# Patient Record
Sex: Male | Born: 1960
Health system: Southern US, Community
[De-identification: ages and names within clinical notes are randomized; demographics above are authoritative.]

## PROBLEM LIST (undated history)

## (undated) DIAGNOSIS — I10 Essential (primary) hypertension: Secondary | ICD-10-CM

## (undated) DIAGNOSIS — Z720 Tobacco use: Secondary | ICD-10-CM

## (undated) HISTORY — PX: HAND SURGERY: SHX662

## (undated) HISTORY — PX: HERNIA REPAIR: SHX51

## (undated) HISTORY — DX: Tobacco use: Z72.0

---

## 2014-04-28 ENCOUNTER — Emergency Department (HOSPITAL_COMMUNITY)
Admission: EM | Admit: 2014-04-28 | Discharge: 2014-04-28 | Disposition: A | Discharge status: Home / Self Care | Payer: BLUE CROSS/BLUE SHIELD | Attending: Emergency Medicine | Admitting: Emergency Medicine

## 2014-04-28 ENCOUNTER — Emergency Department (HOSPITAL_COMMUNITY): Payer: BLUE CROSS/BLUE SHIELD

## 2014-04-28 ENCOUNTER — Encounter (HOSPITAL_COMMUNITY): Payer: Self-pay

## 2014-04-28 DIAGNOSIS — R0602 Shortness of breath: Secondary | ICD-10-CM | POA: Diagnosis not present

## 2014-04-28 DIAGNOSIS — R079 Chest pain, unspecified: Secondary | ICD-10-CM | POA: Diagnosis present

## 2014-04-28 DIAGNOSIS — R0789 Other chest pain: Secondary | ICD-10-CM | POA: Diagnosis not present

## 2014-04-28 DIAGNOSIS — Z720 Tobacco use: Secondary | ICD-10-CM | POA: Insufficient documentation

## 2014-04-28 DIAGNOSIS — I10 Essential (primary) hypertension: Secondary | ICD-10-CM | POA: Diagnosis not present

## 2014-04-28 HISTORY — DX: Essential (primary) hypertension: I10

## 2014-04-28 LAB — URINALYSIS, ROUTINE W REFLEX MICROSCOPIC
Bilirubin Urine: NEGATIVE
Glucose, UA: NEGATIVE mg/dL
Hgb urine dipstick: NEGATIVE
Ketones, ur: NEGATIVE mg/dL
Leukocytes, UA: NEGATIVE
Nitrite: NEGATIVE
Protein, ur: NEGATIVE mg/dL
Specific Gravity, Urine: 1.017 (ref 1.005–1.030)
Urobilinogen, UA: 1 mg/dL (ref 0.0–1.0)
pH: 8 (ref 5.0–8.0)

## 2014-04-28 LAB — COMPREHENSIVE METABOLIC PANEL
ALT: 22 U/L (ref 0–53)
AST: 22 U/L (ref 0–37)
Albumin: 4 g/dL (ref 3.5–5.2)
Alkaline Phosphatase: 42 U/L (ref 39–117)
Anion gap: 9 (ref 5–15)
BUN: 12 mg/dL (ref 6–23)
CO2: 30 mmol/L (ref 19–32)
Calcium: 9 mg/dL (ref 8.4–10.5)
Chloride: 102 mmol/L (ref 96–112)
Creatinine, Ser: 0.81 mg/dL (ref 0.50–1.35)
GFR calc Af Amer: >90 mL/min (ref >90)
GFR calc non Af Amer: >90 mL/min (ref >90)
Glucose, Bld: 93 mg/dL (ref 70–99)
Potassium: 3.5 mmol/L (ref 3.5–5.1)
Sodium: 141 mmol/L (ref 135–145)
Total Bilirubin: 1.3 mg/dL — ABNORMAL HIGH (ref 0.3–1.2)
Total Protein: 7.4 g/dL (ref 6.0–8.3)

## 2014-04-28 LAB — I-STAT CHEM 8, ED
BUN: 11 mg/dL (ref 6–23)
Calcium, Ion: 1.1 mmol/L — ABNORMAL LOW (ref 1.12–1.23)
Chloride: 97 mmol/L (ref 96–112)
Creatinine, Ser: 0.8 mg/dL (ref 0.50–1.35)
Glucose, Bld: 90 mg/dL (ref 70–99)
HCT: 48 % (ref 39.0–52.0)
Hemoglobin: 16.3 g/dL (ref 13.0–17.0)
Potassium: 3.6 mmol/L (ref 3.5–5.1)
Sodium: 141 mmol/L (ref 135–145)
TCO2: 29 mmol/L (ref 0–100)

## 2014-04-28 LAB — CBC
HCT: 45.5 % (ref 39.0–52.0)
Hemoglobin: 16.3 g/dL (ref 13.0–17.0)
MCH: 33.7 pg (ref 26.0–34.0)
MCHC: 35.8 g/dL (ref 30.0–36.0)
MCV: 94 fL (ref 78.0–100.0)
Platelets: 169 10*3/uL (ref 150–400)
RBC: 4.84 MIL/uL (ref 4.22–5.81)
RDW: 12.9 % (ref 11.5–15.5)
WBC: 7.5 10*3/uL (ref 4.0–10.5)

## 2014-04-28 LAB — I-STAT TROPONIN, ED
Troponin i, poc: 0.01 ng/mL (ref 0.00–0.08)
Troponin i, poc: 0.01 ng/mL (ref 0.00–0.08)

## 2014-04-28 LAB — D-DIMER, QUANTITATIVE: D-Dimer, Quant: 0.28 ug/mL-FEU (ref 0.00–0.48)

## 2014-04-28 MED ORDER — MORPHINE SULFATE 4 MG/ML IJ SOLN
4.0000 mg | Freq: Once | INTRAMUSCULAR | Status: AC
  Administered 2014-04-28: 4 mg via INTRAVENOUS
  Filled 2014-04-28: qty 1

## 2014-04-28 MED ORDER — HYDROCODONE-ACETAMINOPHEN 5-325 MG PO TABS: 1.0000 | ORAL_TABLET | Freq: Four times a day (QID) | ORAL | Status: DC | PRN

## 2014-04-28 NOTE — ED Notes (Signed)
Patiaent c/o mid chest pain that began last night around 2200. Patient states he went to work this AM and chest pain worsened. Patient states he has SOB and chest pain is worse with breathing. Patient denies N/V, diaphoresis or weakness.

## 2014-04-28 NOTE — ED Notes (Signed)
Questions concerns denied r/t dc. Education provided regarding use of narcotics.

## 2014-04-28 NOTE — Discharge Instructions (Signed)
Chest Pain (Nonspecific) Call the [facility] today or on 05/01/2014 to schedule the next available office visit. Tell office staff that you were seen here when scheduling the appointment. Ask your primary care physician to help you to stop smoking. Take Tylenol for mild pain or the pain medicine prescribed for bad pain It is often hard to give a diagnosis for the cause of chest pain. There is always a chance that your pain could be related to something serious, such as a heart attack or a blood clot in the lungs. You need to follow up with your doctor. HOME CARE  If antibiotic medicine was given, take it as directed by your doctor. Finish the medicine even if you start to feel better.  For the next few days, avoid activities that bring on chest pain. Continue physical activities as told by your doctor.  Do not use any tobacco products. This includes cigarettes, chewing tobacco, and e-cigarettes.  Avoid drinking alcohol.  Only take medicine as told by your doctor.  Follow your doctor's suggestions for more testing if your chest pain does not go away.  Keep all doctor visits you made. GET HELP IF:  Your chest pain does not go away, even after treatment.  You have a rash with blisters on your chest.  You have a fever. GET HELP RIGHT AWAY IF:   You have more pain or pain that spreads to your arm, neck, jaw, back, or belly (abdomen).  You have shortness of breath.  You cough more than usual or cough up blood.  You have very bad back or belly pain.  You feel sick to your stomach (nauseous) or throw up (vomit).  You have very bad weakness.  You pass out (faint).  You have chills. This is an emergency. Do not wait to see if the problems will go away. Call your local emergency services (911 in [place] Do not drive yourself to the hospital. MAKE SURE YOU:   Understand these instructions.  Will watch your condition.  Will get help right away if you are not doing  well or get worse. Document Released: 07/30/2007 Document Revised: 02/15/2013 Document Reviewed: 07/30/2007 [facility] Patient Information 2015 [place], [place]. This information is not intended to replace advice given to you by your health care provider. Make sure you discuss any questions you have with your health care provider.

## 2014-04-28 NOTE — ED Notes (Signed)
Patient transported to X-ray 

## 2014-04-28 NOTE — Progress Notes (Signed)
pcp is [facility]

## 2014-04-28 NOTE — ED Notes (Signed)
MD at bedside. 

## 2014-04-28 NOTE — ED Provider Notes (Signed)
CSN: [id number]     Arrival date & time 04/28/14  0800 History   First MD Initiated Contact with Patient 04/28/14 [phone]     Chief Complaint  Patient presents with  . Chest Pain  . Shortness of Breath     (Consider location/radiation/quality/duration/timing/severity/associated sxs/prior Treatment) HPI [place] of anterior chest pain worse with deep inspiration onset 1030 p.m. yesterday. He managed to sleep all night. Pain is worse with deep inspiration and with walking. He awakened at 5:30 AM today. With the same pain. Pain became worse while at work. He denies shortness of breath denies nausea denies sweatiness . He treated himself with ibuprofen with partial relief. No other associated symptoms. Pain not improved by anything presently is moderate scribed for 6 or 7 on a scale of 1-10. Past Medical History  Diagnosis Date  . Hypertension    Past Surgical History  Procedure Laterality Date  . Hand surgery    . Hernia repair     History reviewed. No pertinent family history. History  Substance Use Topics  . Smoking status: Current Every Day Smoker -- 0.50 packs/day    Types: Cigarettes  . Smokeless tobacco: Never Used  . Alcohol Use: Yes     Comment: daily intake of 2-3 liquor drinks and 3 beers daily   no illicit drug use  Review of Systems  Cardiovascular: Positive for chest pain.  All other systems reviewed and are negative.     Allergies  Review of patient's allergies indicates not on file.  Home Medications   Prior to Admission medications   Not on File   BP 156/88 mmHg  Pulse 89  Temp(Src) 98.7 F (37.1 C) (Oral)  Resp 20  SpO2 98% Physical Exam  Constitutional: He appears well-developed and well-nourished.  HENT:  Head: Normocephalic and atraumatic.  Eyes: Conjunctivae are normal. Pupils are equal, round, and reactive to light.  Neck: Neck supple. No tracheal deviation present. No thyromegaly present.  Cardiovascular: Normal rate and regular rhythm.  Exam  reveals no friction rub.   No murmur heard. Pulmonary/Chest: Effort normal and breath sounds normal.  Abdominal: Soft. Bowel sounds are normal. He exhibits no distension. There is no tenderness.  Musculoskeletal: Normal range of motion. He exhibits no edema or tenderness.  Neurological: He is alert. Coordination normal.  Skin: Skin is warm and dry. No rash noted.  Psychiatric: He has a normal mood and affect.  Nursing note and vitals reviewed.   ED Course  Procedures (including critical care time) Labs Review Labs Reviewed  CBC  COMPREHENSIVE METABOLIC PANEL  URINALYSIS, ROUTINE W REFLEX MICROSCOPIC  I-STAT [place], ED    Imaging Review No results found.   EKG Interpretation   Date/Time:  Friday April 28 2014 08:08:19 EST Ventricular Rate:  93 PR Interval:  173 QRS Duration: 99 QT Interval:  352 QTC Calculation: 438 R Axis:   50 Text Interpretation:  Sinus rhythm ST elev, probable normal early repol  pattern No old tracing to compare Confirmed by JACUBOWITZ  MD, SAM [phone])  on 04/28/2014 8:20:18 AM     1:30 PM pain much improved after treatment with intravenous morphine. Chest x-ray viewed by me. Results for orders placed or performed during the hospital encounter of 04/28/14  CBC  Result Value Ref Range   WBC 7.5 4.0 - 10.5 K/uL   RBC 4.84 4.22 - 5.81 MIL/uL   Hemoglobin 16.3 13.0 - 17.0 g/dL   HCT 45.5 39.0 - 52.0 %   MCV 94.0 78.0 -  100.0 fL   MCH 33.7 26.0 - 34.0 pg   MCHC 35.8 30.0 - 36.0 g/dL   RDW 12.9 11.5 - 15.5 %   Platelets 169 150 - 400 K/uL  Comprehensive metabolic panel  Result Value Ref Range   Sodium 141 135 - 145 mmol/L   Potassium 3.5 3.5 - 5.1 mmol/L   Chloride 102 96 - 112 mmol/L   CO2 30 19 - 32 mmol/L   Glucose, Bld 93 70 - 99 mg/dL   BUN 12 6 - 23 mg/dL   Creatinine, Ser 0.81 0.50 - 1.35 mg/dL   Calcium 9.0 8.4 - 10.5 mg/dL   Total Protein 7.4 6.0 - 8.3 g/dL   Albumin 4.0 3.5 - 5.2 g/dL   AST 22 0 - 37 U/L   ALT 22 0 - 53 U/L    Alkaline Phosphatase 42 39 - 117 U/L   Total Bilirubin 1.3 (H) 0.3 - 1.2 mg/dL   GFR calc non Af Amer >90 >90 mL/min   GFR calc Af Amer >90 >90 mL/min   Anion gap 9 5 - 15  Urinalysis, Routine w reflex microscopic  Result Value Ref Range   Color, Urine YELLOW YELLOW   APPearance CLOUDY (A) CLEAR   Specific Gravity, Urine 1.017 1.005 - 1.030   pH 8.0 5.0 - 8.0   Glucose, UA NEGATIVE NEGATIVE mg/dL   Hgb urine dipstick NEGATIVE NEGATIVE   Bilirubin Urine NEGATIVE NEGATIVE   Ketones, ur NEGATIVE NEGATIVE mg/dL   Protein, ur NEGATIVE NEGATIVE mg/dL   Urobilinogen, UA 1.0 0.0 - 1.0 mg/dL   Nitrite NEGATIVE NEGATIVE   Leukocytes, UA NEGATIVE NEGATIVE  D-dimer, quantitative  Result Value Ref Range   D-Dimer, Quant 0.28 0.00 - 0.48 ug/mL-FEU  I-stat troponin, ED (not at [facility])  Result Value Ref Range   Troponin i, poc 0.01 0.00 - 0.08 ng/mL   Comment 3          I-stat troponin, ED  Result Value Ref Range   Troponin i, poc 0.01 0.00 - 0.08 ng/mL   Comment 3          I-stat chem 8, ed  Result Value Ref Range   Sodium 141 135 - 145 mmol/L   Potassium 3.6 3.5 - 5.1 mmol/L   Chloride 97 96 - 112 mmol/L   BUN 11 6 - 23 mg/dL   Creatinine, Ser 0.80 0.50 - 1.35 mg/dL   Glucose, Bld 90 70 - 99 mg/dL   Calcium, Ion 1.10 (L) 1.12 - 1.23 mmol/L   TCO2 29 0 - 100 mmol/L   Hemoglobin 16.3 13.0 - 17.0 g/dL   HCT 48.0 39.0 - 52.0 %   Dg Chest 2 View  04/28/2014   CLINICAL DATA:  Mid chest pain beginning last night an worsening this morning. Smoking history. Cough for the last year.  EXAM: CHEST  2 VIEW  COMPARISON:  None.  FINDINGS: Artifact overlies the chest. Heart size is normal. Mediastinal shadows are normal. There is elevation of the posterior left hemidiaphragm. The lungs are clear. No effusions. No bony abnormalities.  IMPRESSION: Normal chest with the exception of elevation of the posterior aspect of the left hemidiaphragm.   Electronically Signed   By: Nelson Chimes M.D.   On: 04/28/2014  09:03   councilled pt for 44minutes onsmoking cessation Chest xray viewed by me MDM  Patient is cared for outpatient cardiac evaluation. Heart score equals 3riskfactors,smokinghypertensionatypicalstory.Negativetroponin,age,nonspecificEKG. Low pretest clinical suspicion for pulmonary embolism. Negative d-dimer.Plan prescription Norco.  Referral [facility] Diagnosis #1atypical chest pain #2 tobacco abuse Final diagnoses:  None        Orlie Dakin, MD 04/28/14 1340

## 2014-05-26 ENCOUNTER — Ambulatory Visit (INDEPENDENT_AMBULATORY_CARE_PROVIDER_SITE_OTHER): Payer: BLUE CROSS/BLUE SHIELD | Admitting: Internal Medicine

## 2014-05-26 ENCOUNTER — Encounter: Payer: Self-pay | Admitting: Internal Medicine

## 2014-05-26 VITALS — BP 136/96 | HR 70 | Ht 70.0 in | Wt 215.1 lb

## 2014-05-26 DIAGNOSIS — R0781 Pleurodynia: Secondary | ICD-10-CM

## 2014-05-26 DIAGNOSIS — Z720 Tobacco use: Secondary | ICD-10-CM | POA: Diagnosis not present

## 2014-05-26 DIAGNOSIS — I10 Essential (primary) hypertension: Secondary | ICD-10-CM | POA: Diagnosis not present

## 2014-05-26 HISTORY — DX: Tobacco use: Z72.0

## 2014-05-26 NOTE — Progress Notes (Signed)
OFFICE NOTE  Chief Complaint:  Bursae department follow-up  Primary Care Physician: Imelda Pillow, NP  HPI:  Brian Barrett is a pleasant 54 year old male who was referred to me from the emergency department for follow-up of chest pain. He presented with an achy chest pain that was worse with taking deep inspirations. It was present for several days. It seemed to respond to pain medicine and ultimately went away. He's had no recurrence or chest pain or shortness of breath with exertion. He stays fairly active, walking at work and plays golf. He denies any history of heart disease. There is no family history of heart disease. He does have hypertension which is been treated for about 5 years but may been present longer. Blood pressure is elevated today however he reports he takes his blood pressure medicine the afternoons. EKG in the hospital showed elevated voltages and J-point elevation concerning for I repeated an EKG today and it looks unchanged. Hospital workup failed to reveal any ischemia and troponins were negative.  PMHx:  Past Medical History  Diagnosis Date  . Hypertension     Past Surgical History  Procedure Laterality Date  . Hand surgery    . Hernia repair      FAMHx:  Family History  Problem Relation Age of Onset  . Other Mother     Mother died of a blood transfusion reaction  SOCHx:   reports that he has been smoking Cigarettes.  He has been smoking about 0.50 packs per day. He has never used smokeless tobacco. He reports that he drinks alcohol. He reports that he does not use illicit drugs.  ALLERGIES:  No Known Allergies  ROS: A comprehensive review of systems was negative.  HOME MEDS: Current Outpatient Prescriptions  Medication Sig Dispense Refill  . ibuprofen (ADVIL,MOTRIN) 200 MG tablet Take 600 mg by mouth every 4 (four) hours as needed for headache, moderate pain or cramping.    Marland Kitchen lisinopril-hydrochlorothiazide (PRINZIDE,ZESTORETIC)  10-12.5 MG per tablet Take 1 tablet by mouth daily.     No current facility-administered medications for this visit.    LABS/IMAGING: No results found for this or any previous visit (from the past 48 hour(s)). No results found.  WEIGHTS: Wt Readings from Last 3 Encounters:  05/26/14 215 lb 1.6 oz (97.569 kg)    VITALS: BP 136/96 mmHg  Pulse 70  Ht 5\' 10"  (1.778 m)  Wt 215 lb 1.6 oz (97.569 kg)  BMI 30.86 kg/m2  EXAM: General appearance: alert and no distress Neck: no carotid bruit, no JVD and thyroid not enlarged, symmetric, no tenderness/mass/nodules Lungs: clear to auscultation bilaterally Heart: regular rate and rhythm, S1, S2 normal, no murmur, click, rub or gallop Abdomen: soft, non-tender; bowel sounds normal; no masses,  no organomegaly Extremities: extremities normal, atraumatic, no cyanosis or edema Pulses: 2+ and symmetric Skin: Skin color, texture, turgor normal. No rashes or lesions Neurologic: Grossly normal Psych: Normal  EKG: Normal sinus rhythm at 63, minimum voltage criteria for LVH with repolarization changes  ASSESSMENT: 1. Pleuritic chest pain-resolved 2. Hypertension 3. Voltage criteria for LVH with repolarization 4. Tobacco abuse  PLAN: 1.   Mr. Heal has had hypertension for least 5 years and seems to be well-controlled. He does have some EKG changes concerning for LVH with repolarization. His chest pain was atypical and did not sound cardiac. He's continued to be active without any symptoms. I don't feel a further cardiac workup is necessary at this time. He should have aggressive  risk factor modification. He is a smoker and we talked about tobacco cessation. He has successfully quit in the past. I'm happy to see him back on an as-needed basis.  Pixie Casino, MD, [facility] Attending Cardiologist [facility]  HILTY,Kenneth C 05/26/2014, 4:45 PM

## 2014-05-26 NOTE — Patient Instructions (Signed)
Your physician recommends that you schedule a follow-up appointment as needed  

## 2014-05-30 NOTE — Addendum Note (Signed)
Addended by: Diana Eves on: 05/30/2014 05:47 PM   Modules accepted: Orders

## 2014-06-11 ENCOUNTER — Encounter: Payer: Self-pay | Admitting: Family Medicine

## 2014-07-04 ENCOUNTER — Ambulatory Visit (INDEPENDENT_AMBULATORY_CARE_PROVIDER_SITE_OTHER): Payer: BLUE CROSS/BLUE SHIELD | Admitting: Podiatry

## 2014-07-04 ENCOUNTER — Encounter: Payer: Self-pay | Admitting: Podiatry

## 2014-07-04 ENCOUNTER — Ambulatory Visit (INDEPENDENT_AMBULATORY_CARE_PROVIDER_SITE_OTHER): Payer: BLUE CROSS/BLUE SHIELD

## 2014-07-04 VITALS — BP 143/85 | HR 72 | Resp 16

## 2014-07-04 DIAGNOSIS — L608 Other nail disorders: Secondary | ICD-10-CM | POA: Diagnosis not present

## 2014-07-04 DIAGNOSIS — Q828 Other specified congenital malformations of skin: Secondary | ICD-10-CM

## 2014-07-04 DIAGNOSIS — L301 Dyshidrosis [pompholyx]: Secondary | ICD-10-CM | POA: Diagnosis not present

## 2014-07-04 DIAGNOSIS — M79673 Pain in unspecified foot: Secondary | ICD-10-CM | POA: Diagnosis not present

## 2014-07-04 DIAGNOSIS — L603 Nail dystrophy: Secondary | ICD-10-CM

## 2014-07-04 NOTE — Progress Notes (Signed)
   Subjective:    Patient ID: Brian Barrett, male    DOB: 1960/10/28, 54 y.o.   MRN: [id number]  HPI Comments:  N: Fungis on l<R big toe, Callusis on Left foot L: Left foot D: Last 6 months O:  C: On and off, nagging  A: When walking T: Use foot powder from over the counter     Review of Systems  All other systems reviewed and are negative.      Objective:   Physical Exam: I have reviewed his past medical history medications allergies surgery social history and review of systems. Pulses are strongly palpable bilateral. Neurologic sensorium is intact per Semmes-Weinstein monofilament. Deep tendon reflexes are intact bilateral and muscle strength is 5 over 5 dorsiflexion plantar flexors and inverters and everters all intrinsic musculature is intact. Orthopedic evaluation demonstrates all joints distal to the ankle for range of motion with exception of mild hammertoe deformities bilateral. Cutaneous evaluation of straight supple well-hydrated cutis thick yellow dystrophic, mycotic hallux nails bilateral. Maceration between the digits is indicative hyperhidrosis and superficial reactive hyperkeratosis to the lateral aspect of the PIPJ second digit of the left foot secondary to maceration as well as juxtaposition of his hammertoe deformities.        Assessment & Plan:  Assessment: Painful corn and callus to his second digit left foot. Hyperhidrosis with skin breakdown. Onychomycosis nail dystrophy hallux bilateral.  Plan: Discussed appropriate shoe gear stretching exercises ice therapy shoe gear modifications debrided the area of reactive hyperkeratosis today and discussed aluminum chloride to be placed between the toes. I also took samples of the nails and skin today to be sent for pathologic evaluation will follow up with him in the near future. We also placed padding between the toe.

## 2014-07-11 LAB — HM COLONOSCOPY

## 2014-07-25 ENCOUNTER — Encounter: Payer: Self-pay | Admitting: Podiatry

## 2014-07-25 ENCOUNTER — Ambulatory Visit (INDEPENDENT_AMBULATORY_CARE_PROVIDER_SITE_OTHER): Payer: BLUE CROSS/BLUE SHIELD | Admitting: Podiatry

## 2014-07-25 DIAGNOSIS — Z79899 Other long term (current) drug therapy: Secondary | ICD-10-CM

## 2014-07-25 DIAGNOSIS — L603 Nail dystrophy: Secondary | ICD-10-CM

## 2014-07-25 MED ORDER — TERBINAFINE HCL 250 MG PO TABS: 250.0000 mg | ORAL_TABLET | Freq: Every day | ORAL | Status: DC

## 2014-07-25 NOTE — Progress Notes (Signed)
He presents today for follow-up of his toenail cultures.  Objective: Onychomycosis.  Assessment: Onychomycosis.  Plan: We discussed the etiology pathology conservative versus surgical therapies. Discussed the pros and cons and use of oral versus topical versus laser therapy. At this point he would like to try oral therapy for 4 months Lamisil. We started him on this medication today and follow up with him in 30 days blood work was requested consisting of a liver profile and CBC should discuss back and normal we will notify him immediately.

## 2014-07-25 NOTE — Patient Instructions (Signed)

## 2014-08-02 ENCOUNTER — Telehealth: Payer: Self-pay | Admitting: *Deleted

## 2014-08-02 LAB — HEPATIC FUNCTION PANEL
ALT: 26 U/L (ref 0–53)
AST: 24 U/L (ref 0–37)
Albumin: 4.3 g/dL (ref 3.5–5.2)
Alkaline Phosphatase: 40 U/L (ref 39–117)
Bilirubin, Direct: 0.4 mg/dL — ABNORMAL HIGH (ref 0.0–0.3)
Indirect Bilirubin: 1.2 mg/dL (ref 0.2–1.2)
Total Bilirubin: 1.6 mg/dL — ABNORMAL HIGH (ref 0.2–1.2)
Total Protein: 6.9 g/dL (ref 6.0–8.3)

## 2014-08-02 NOTE — Telephone Encounter (Addendum)
-----   Message from Rip Harbour, [facility] sent at 08/02/2014  4:39 PM EDT ----- Left message to call for results.  Left message giving pt his order from Dr. Milinda Pointer, and pt called again and I gave him the orders verbally. Left message with wife for pt to call for lab results and instructions. ----- Message -----    From: Garrel Ridgel, DPM    Sent: 08/02/2014  12:32 PM      To: Avanell Shackleton Prevette, PMAC  Liver enzymes are good and may continue medications

## 2014-08-24 ENCOUNTER — Ambulatory Visit: Payer: BLUE CROSS/BLUE SHIELD | Admitting: Podiatry

## 2014-09-22 ENCOUNTER — Ambulatory Visit: Payer: BLUE CROSS/BLUE SHIELD | Admitting: Family Medicine

## 2014-09-26 ENCOUNTER — Encounter: Payer: Self-pay | Admitting: Family Medicine

## 2014-09-26 ENCOUNTER — Ambulatory Visit (INDEPENDENT_AMBULATORY_CARE_PROVIDER_SITE_OTHER): Payer: BLUE CROSS/BLUE SHIELD | Admitting: Family Medicine

## 2014-09-26 VITALS — BP 132/88 | HR 69 | Temp 98.4°F | Ht 70.0 in | Wt 213.5 lb

## 2014-09-26 DIAGNOSIS — Z23 Encounter for immunization: Secondary | ICD-10-CM

## 2014-09-26 DIAGNOSIS — I868 Varicose veins of other specified sites: Secondary | ICD-10-CM | POA: Diagnosis not present

## 2014-09-26 DIAGNOSIS — R21 Rash and other nonspecific skin eruption: Secondary | ICD-10-CM | POA: Diagnosis not present

## 2014-09-26 DIAGNOSIS — I8390 Asymptomatic varicose veins of unspecified lower extremity: Secondary | ICD-10-CM | POA: Insufficient documentation

## 2014-09-26 NOTE — Patient Instructions (Addendum)
BEFORE YOU LEAVE: -Tdap -schedule physical in 3-4 months, come fasting - drink plenty of water  Quit smoking  Cut back on the drinking  If skin lesions not resolve din 3 weeks please call to schedule appointment with the dermatologist  We recommend the following healthy lifestyle measures: - eat a healthy diet consisting of lots of vegetables, fruits, beans, nuts, seeds, healthy meats such as white chicken and fish and whole grains.  - avoid fried foods, fast food, processed foods, sodas, red meet and other fattening foods.  - get a least 150 minutes of aerobic exercise per week.

## 2014-09-26 NOTE — Addendum Note (Signed)
Addended by: Agnes Lawrence on: 09/26/2014 05:20 PM   Modules accepted: Orders

## 2014-09-26 NOTE — Progress Notes (Signed)
HPI:  Brian Barrett is here to establish care. Used to see Dr. Pablo Lawrence but wants to transfer as this was an urgent care.  Has the following chronic problems that require follow up and concerns today:  HTN: -dx abut 5 years ago and on medications since -meds: lisinopril-hctz 10-12.5 daily, did not take yet today -denies: CP, SOB, DOE, swelling  Tobacco: -1 ppd for the last 2 years ->30 pack year -quit for 5 years several years ago, he reports quit easily -he has a smokers cough the last year, occ night sweats, no SOB or DOE - had a chest xray  Skin Rash -started about 5 weeks ago -several spots on arms and legs -was oozing and raw, this resolved with abx, but now ring shape and itchy  ROS negative for unless reported above: fevers, unintentional weight loss, hearing or vision loss, chest pain, palpitations, struggling to breath, hemoptysis, melena, hematochezia, hematuria, falls, loc, si, thoughts of self harm  Past Medical History  Diagnosis Date  . Hypertension     Past Surgical History  Procedure Laterality Date  . Hand surgery    . Hernia repair      Family History  Problem Relation Age of Onset  . Other Mother     deceased  . Bone cancer Paternal Uncle     History   Social History  . Marital Status: Married    Spouse Name: N/A  . Number of Children: N/A  . Years of Education: N/A   Social History Main Topics  . Smoking status: Current Every Day Smoker -- 0.50 packs/day    Types: Cigarettes  . Smokeless tobacco: Never Used  . Alcohol Use: 0.0 oz/week    0 Standard drinks or equivalent per week     Comment: daily intake of 2-3 liquor drinks and 3 beers daily  . Drug Use: No  . Sexual Activity: Not on file   Other Topics Concern  . 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  lisinopril-hydrochlorothiazide (PRINZIDE,ZESTORETIC) 10-12.5 MG per tablet, Take 1 tablet by mouth daily., Disp: , Rfl:   EXAM:  Filed Vitals:   09/26/14 1621  BP: 132/88  Pulse: 69  Temp: 98.4 F (36.9 C)    Body mass index is 30.63 kg/(m^2).  GENERAL: vitals reviewed and listed above, alert, oriented, appears well hydrated and in no acute distress  HEENT: atraumatic, conjunttiva clear, no obvious abnormalities on inspection of external nose and ears  NECK: no obvious masses on inspection  LUNGS: clear to auscultation bilaterally, no wheezes, rales or rhonchi, good air movement  CV: HRRR, no peripheral edema  MS: moves all extremities without noticeable abnormality  PSYCH: pleasant and cooperative, no obvious depression or anxiety  ASSESSMENT AND PLAN:  Discussed the following assessment and plan:  Rash and nonspecific skin eruption  Varicose veins -We reviewed the PMH, PSH, FH, SH, Meds and Allergies. -We provided refills for any medications we will prescribe as needed. -We addressed current concerns per orders and patient instructions. -We have asked for records for pertinent exams, studies, vaccines and notes from previous providers. -We have advised patient to follow up per instructions below.   -Patient  advised to return or notify a doctor immediately if symptoms worsen or persist or new concerns arise.  Patient Instructions  BEFORE YOU LEAVE: -Tdap -schedule physical in 3-4 months, come fasting - drink plenty of water  Quit smoking  Cut back on the drinking  If skin lesions not resolve din 3 weeks please call to schedule appointment with the dermatologist  We recommend the following healthy lifestyle measures: - eat a healthy diet consisting of lots of vegetables, fruits, beans, nuts, seeds, healthy meats such as white chicken and fish and whole grains.  - avoid fried foods, fast food, processed foods,  sodas, red meet and other fattening foods.  - get a least 150 minutes of aerobic exercise per week.       Colin Benton R.

## 2014-09-26 NOTE — Progress Notes (Signed)
Pre visit review using our clinic review tool, if applicable. No additional management support is needed unless otherwise documented below in the visit note. 

## 2014-11-14 ENCOUNTER — Telehealth: Payer: Self-pay | Admitting: Family Medicine

## 2014-11-14 MED ORDER — LISINOPRIL-HYDROCHLOROTHIAZIDE 10-12.5 MG PO TABS: 1.0000 | ORAL_TABLET | Freq: Every day | ORAL | Status: DC

## 2014-11-14 NOTE — Telephone Encounter (Signed)
Patient needs a refill of his lisinopril-hydrochlorothiazide (PRINZIDE,ZESTORETIC) 10-12.5 MG per tablet called to [facility] on [address] please.

## 2014-12-21 ENCOUNTER — Encounter: Payer: Self-pay | Admitting: Podiatry

## 2015-02-16 ENCOUNTER — Encounter: Payer: BLUE CROSS/BLUE SHIELD | Admitting: Family Medicine

## 2015-02-22 ENCOUNTER — Encounter: Payer: BLUE CROSS/BLUE SHIELD | Admitting: Family Medicine

## 2015-07-12 ENCOUNTER — Ambulatory Visit: Payer: BLUE CROSS/BLUE SHIELD | Admitting: Family Medicine

## 2015-07-16 ENCOUNTER — Encounter: Payer: Self-pay | Admitting: Family Medicine

## 2015-07-16 ENCOUNTER — Ambulatory Visit (INDEPENDENT_AMBULATORY_CARE_PROVIDER_SITE_OTHER): Payer: BLUE CROSS/BLUE SHIELD | Admitting: Family Medicine

## 2015-07-16 VITALS — BP 142/92 | HR 77 | Temp 98.8°F | Ht 70.0 in | Wt 209.7 lb

## 2015-07-16 DIAGNOSIS — R3129 Other microscopic hematuria: Secondary | ICD-10-CM | POA: Diagnosis not present

## 2015-07-16 DIAGNOSIS — E669 Obesity, unspecified: Secondary | ICD-10-CM

## 2015-07-16 DIAGNOSIS — I10 Essential (primary) hypertension: Secondary | ICD-10-CM

## 2015-07-16 DIAGNOSIS — Z720 Tobacco use: Secondary | ICD-10-CM

## 2015-07-16 DIAGNOSIS — R310 Gross hematuria: Secondary | ICD-10-CM | POA: Diagnosis not present

## 2015-07-16 LAB — POCT URINALYSIS DIPSTICK
Bilirubin, UA: NEGATIVE
Glucose, UA: NEGATIVE
Ketones, UA: NEGATIVE
Leukocytes, UA: NEGATIVE
Nitrite, UA: NEGATIVE
Protein, UA: NEGATIVE
Spec Grav, UA: 1.02
Urobilinogen, UA: 0.2
pH, UA: 6

## 2015-07-16 NOTE — Progress Notes (Signed)
HPI:  Brian Barrett is a pleasant 55 yo here for an acute visit for:  Gross Hematuria: -occurred once 1 week ago -small amount with one episode of urination -had done heavy lifting prior to this episode -did not have any frequency, urgency, discharge, dysuria, flank pain, vomiting, nausea, fevers or any other symptoms  Of note, he saw [place] once last year, then did not follow up as advised. He has the following chronic medical problems:  HTN/Obesity: -dx about 5 years ago and on medications since -meds: lisinopril-hctz 10-12.5 daily, has not taken medications in a few months -had been really changing diet and is active and has lost weight so thought he did not need his bp medications -denies: CP, SOB, DOE, swelling  Tobacco: -still smoking  -interested in quitting, motivated somewhat -feels can quit on his own without medications to help as has done so before -he is afraid if he quits smoking he will gain weight -1 ppd fcurrently ->30 pack year -quit for 5 years several years ago, he reports quit easily -he has a smokers cough the last year, occ night sweats, no SOB or DOE - had a chest xray  Snoring/?OSA:  ROS: See pertinent positives and negatives per HPI.  Past Medical History  Diagnosis Date  . Hypertension     Past Surgical History  Procedure Laterality Date  . Hand surgery    . Hernia repair      Family History  Problem Relation Age of Onset  . Other Mother     deceased  . Bone cancer Paternal Uncle     Social History   Social History  . Marital Status: Married    Spouse Name: N/A  . Number of Children: N/A  . Years of Education: N/A   Social History Main Topics  . Smoking status: Current Every Day Smoker -- 0.50 packs/day    Types: Cigarettes  . Smokeless tobacco: Never Used  . Alcohol Use: 0.0 oz/week    0 Standard drinks or equivalent per week     Comment: daily intake of 2-3 liquor drinks and 3 beers daily  . Drug Use: No  . Sexual  Activity: Not Asked   Other Topics Concern  . 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  lisinopril-hydrochlorothiazide (PRINZIDE,ZESTORETIC) 10-12.5 MG per tablet, Take 1 tablet by mouth daily., Disp: 90 tablet, Rfl: 3  EXAM:  Filed Vitals:   07/16/15 1429  BP: 142/92  Pulse: 77  Temp: 98.8 F (37.1 C)    Body mass index is 30.09 kg/(m^2).  GENERAL: vitals reviewed and listed above, alert, oriented, appears well hydrated and in no acute distress  HEENT: atraumatic, conjunttiva clear, no obvious abnormalities on inspection of external nose and ears  NECK: no obvious masses on inspection  LUNGS: clear to auscultation bilaterally, no wheezes, rales or rhonchi, good air movement  CV: HRRR, no peripheral edema  ABD: BS+, soft, NTTP  MS: moves all extremities without noticeable abnormality  PSYCH: pleasant and cooperative, no obvious depression or anxiety  ASSESSMENT AND PLAN:  Discussed the following assessment and plan:  Microscopic hematuria -udip with blood, umicro and culture pending -given smoking hx, low threshold  for urology referral   Gross hematuria - Plan: POC Urinalysis Dipstick, Urine Microscopic Only, Urine culture  Tobacco abuse -advised to quit -counseled 3-5 minutes and offered help -he agrees needs to quit, does not want to try wellbutrin or other options to help, does want help with wt loss  Essential hypertension -restart medications -needs labs, refused today -he agreed to do at physical or follow up  Obesity: -lifestyle recs -discussed pharmacological options and he plans to try alli  -Patient advised to return or notify a doctor immediately if symptoms worsen or persist or new concerns arise.  Patient Instructions   BEFORE YOU LEAVE: -schedule blood pressure recheck in 2-4 weeks; can do fasting labs then or at physical -quitline information -Physical exam in 3 months  Will check a few other urine tests, but if negative will send referral to urologist.  Please quit smoking. Please let us know if you need help.  Restart your blood pressure medication  Alli (orlistat) is the medication we discussed for weight loss  We recommend the following healthy lifestyle measures: - eat a healthy whole foods diet consisting of regular small meals composed of vegetables, fruits, beans, nuts, seeds, healthy meats such as white chicken and fish and whole grains.  - avoid sweets, white starchy foods, fried foods, fast food, processed foods, sodas, red meet and other fattening foods.  - get a least 150-300 minutes of aerobic exercise per week.   We have ordered labs or studies at this visit. It can take up to 1-2 weeks for results and processing. IF results require follow up or explanation, we will call you with instructions. Clinically stable results will be released to your [facility]. If you have not heard from [place] or cannot find your results in [facility] in 2 weeks please contact our office at [phone].  If you are not yet signed up for [facility], please consider signing up.            Colin Benton R.

## 2015-07-16 NOTE — Patient Instructions (Addendum)
BEFORE YOU LEAVE: -schedule blood pressure recheck in 2-4 weeks; can do fasting labs then or at physical -quitline information -Physical exam in 3 months  Will check a few other urine tests, but if negative will send referral to urologist.  Please quit smoking. Please let us know if you need help.  Restart your blood pressure medication  Alli (orlistat) is the medication we discussed for weight loss  We recommend the following healthy lifestyle measures: - eat a healthy whole foods diet consisting of regular small meals composed of vegetables, fruits, beans, nuts, seeds, healthy meats such as white chicken and fish and whole grains.  - avoid sweets, white starchy foods, fried foods, fast food, processed foods, sodas, red meet and other fattening foods.  - get a least 150-300 minutes of aerobic exercise per week.   We have ordered labs or studies at this visit. It can take up to 1-2 weeks for results and processing. IF results require follow up or explanation, we will call you with instructions. Clinically stable results will be released to your [facility]. If you have not heard from [place] or cannot find your results in [facility] in 2 weeks please contact our office at [phone].  If you are not yet signed up for [facility], please consider signing up.

## 2015-07-16 NOTE — Progress Notes (Signed)
Pre visit review using our clinic review tool, if applicable. No additional management support is needed unless otherwise documented below in the visit note. 

## 2015-07-17 LAB — URINALYSIS, MICROSCOPIC ONLY: WBC, UA: NONE SEEN (ref 0–?)

## 2015-07-18 LAB — URINE CULTURE
Colony Count: NO GROWTH
Organism ID, Bacteria: NO GROWTH

## 2015-07-20 ENCOUNTER — Telehealth: Payer: Self-pay | Admitting: Family Medicine

## 2015-07-20 MED ORDER — LISINOPRIL-HYDROCHLOROTHIAZIDE 10-12.5 MG PO TABS: 1.0000 | ORAL_TABLET | Freq: Every day | ORAL | Status: DC

## 2015-07-20 NOTE — Telephone Encounter (Signed)
Rx done. 

## 2015-07-20 NOTE — Telephone Encounter (Signed)
Pt called to say he thought Dr Maudie Mercury was going to refer him to a Urologist and would like a call back

## 2015-07-20 NOTE — Telephone Encounter (Signed)
Pt was to restart his bp med, but it was not at the pharmacy. Can you send in lisinopril-hydrochlorothiazide (PRINZIDE,ZESTORETIC) 10-12.5 MG per tablet  [facility]/ [address]

## 2015-07-20 NOTE — Telephone Encounter (Signed)
Wife would also like the results of his test before the weekend and if pt will need referral. Pt would like to know before the weekend if possible. Wife states pt is at work and ok to call her.

## 2015-07-24 NOTE — Telephone Encounter (Signed)
See results note from 5/26.

## 2015-08-06 ENCOUNTER — Encounter: Payer: Self-pay | Admitting: Family Medicine

## 2015-08-06 ENCOUNTER — Ambulatory Visit (INDEPENDENT_AMBULATORY_CARE_PROVIDER_SITE_OTHER): Payer: BLUE CROSS/BLUE SHIELD | Admitting: Family Medicine

## 2015-08-06 VITALS — BP 136/92 | HR 84 | Temp 98.4°F | Ht 70.0 in | Wt 214.6 lb

## 2015-08-06 DIAGNOSIS — R310 Gross hematuria: Secondary | ICD-10-CM

## 2015-08-06 DIAGNOSIS — Z720 Tobacco use: Secondary | ICD-10-CM

## 2015-08-06 DIAGNOSIS — I10 Essential (primary) hypertension: Secondary | ICD-10-CM

## 2015-08-06 MED ORDER — LISINOPRIL-HYDROCHLOROTHIAZIDE 20-25 MG PO TABS: 1.0000 | ORAL_TABLET | Freq: Every day | ORAL | Status: DC

## 2015-08-06 NOTE — Progress Notes (Signed)
Pre visit review using our clinic review tool, if applicable. No additional management support is needed unless otherwise documented below in the visit note. 

## 2015-08-06 NOTE — Progress Notes (Signed)
HPI:  Follow up:  BP check: -hx poor compliance -restarted blood pressure medicaiton last visit -reports is tolerating well and feels great -No chest pain, shortness of breath or headaches  Hematuria: -per pt once after heavy lifting -normal micro and culture -advised urology referral or serial UAs - he opted for serial UA with micro - monthly x3 months, if any further bleeding would def advise urology referral   ROS: See pertinent positives and negatives per HPI.  Past Medical History  Diagnosis Date  . Hypertension   . Tobacco abuse 05/26/2014    Past Surgical History  Procedure Laterality Date  . Hand surgery    . Hernia repair      Family History  Problem Relation Age of Onset  . Other Mother     deceased  . Bone cancer Paternal Uncle     Social History   Social History  . Marital Status: Married    Spouse Name: N/A  . Number of Children: N/A  . Years of Education: N/A   Social History Main Topics  . Smoking status: Current Every Day Smoker -- 0.50 packs/day    Types: Cigarettes  . Smokeless tobacco: Never Used  . Alcohol Use: 0.0 oz/week    0 Standard drinks or equivalent per week     Comment: daily intake of 2-3 liquor drinks and 3 beers daily  . Drug Use: No  . Sexual Activity: Not Asked   Other Topics Concern  . 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  lisinopril-hydrochlorothiazide (PRINZIDE,ZESTORETIC) 20-25 MG tablet, Take 1 tablet by mouth daily., Disp: 90 tablet, Rfl: 3  EXAM:  Filed Vitals:   08/06/15 1621  BP: 136/92  Pulse: 84  Temp: 98.4 F (36.9 C)    Body mass index is 30.79 kg/(m^2).  GENERAL: vitals reviewed and listed above, alert, oriented, appears well hydrated and in no acute distress  HEENT:  atraumatic, conjunttiva clear, no obvious abnormalities on inspection of external nose and ears  NECK: no obvious masses on inspection  LUNGS: clear to auscultation bilaterally, no wheezes, rales or rhonchi, good air movement  CV: HRRR, no peripheral edema  MS: moves all extremities without noticeable abnormality  PSYCH: pleasant and cooperative, no obvious depression or anxiety  ASSESSMENT AND PLAN:  Discussed the following assessment and plan:  Essential hypertension -Opted to increase his blood pressure medication, can take 2 tablets until he uses at the current dose, explained this to him  Gross hematuria -Have discussed potential causes with most likely cause the heavy strenuous exercise/lifting that he did. No further symptoms, discussed low threshold to have him see a urologist given his smoking history and increased risk for bladder cancer -For now he prefers to monitor monthly and has lab visit set up later this month  Tobacco abuse -Advised to quit  -Patient advised to return or notify a doctor immediately if symptoms worsen or persist or new concerns arise.  Patient Instructions  BEFORE YOU LEAVE: -set up nurse visit for BP recheck in 2-3 months - if can do when comes for labs on the 28th that would save him a trip -follow up with Dr. Maudie Mercury for physical  as scheduled in August  Increase the blood pressure to two tablets until you use up the current pills. Then pick up the new dose from the pharmacy and take one tablet daily.  Call if any further bleeding and get urine tests monthly for 3 months.  We recommend the following healthy lifestyle measures: - eat a healthy whole foods diet consisting of regular small meals composed of vegetables, fruits, beans, nuts, seeds, healthy meats such as white chicken and fish and whole grains.  - avoid sweets, white starchy foods, fried foods, fast food, processed foods, sodas, red meet and other fattening foods.  - get a least  150-300 minutes of aerobic exercise per week.   Quit smoking. Let us know if we can help.     Colin Benton R.

## 2015-08-06 NOTE — Patient Instructions (Addendum)
BEFORE YOU LEAVE: -set up nurse visit for BP recheck in 2-3 months - if can do when comes for labs on the 28th that would save him a trip -follow up with Dr. Maudie Mercury for physical as scheduled in August  Increase the blood pressure to two tablets until you use up the current pills. Then pick up the new dose from the pharmacy and take one tablet daily.  Call if any further bleeding and get urine tests monthly for 3 months.  We recommend the following healthy lifestyle measures: - eat a healthy whole foods diet consisting of regular small meals composed of vegetables, fruits, beans, nuts, seeds, healthy meats such as white chicken and fish and whole grains.  - avoid sweets, white starchy foods, fried foods, fast food, processed foods, sodas, red meet and other fattening foods.  - get a least 150-300 minutes of aerobic exercise per week.   Quit smoking. Let us know if we can help.

## 2015-08-22 ENCOUNTER — Ambulatory Visit: Payer: BLUE CROSS/BLUE SHIELD | Admitting: *Deleted

## 2015-08-22 ENCOUNTER — Other Ambulatory Visit (INDEPENDENT_AMBULATORY_CARE_PROVIDER_SITE_OTHER): Payer: BLUE CROSS/BLUE SHIELD

## 2015-08-22 ENCOUNTER — Encounter: Payer: Self-pay | Admitting: *Deleted

## 2015-08-22 VITALS — BP 142/90

## 2015-08-22 DIAGNOSIS — R319 Hematuria, unspecified: Secondary | ICD-10-CM | POA: Diagnosis not present

## 2015-08-22 DIAGNOSIS — I10 Essential (primary) hypertension: Secondary | ICD-10-CM

## 2015-08-22 LAB — POC URINALSYSI DIPSTICK (AUTOMATED)
Bilirubin, UA: NEGATIVE
Blood, UA: NEGATIVE
Glucose, UA: NEGATIVE
Ketones, UA: NEGATIVE
Leukocytes, UA: NEGATIVE
Nitrite, UA: NEGATIVE
Protein, UA: NEGATIVE
Spec Grav, UA: 1.025
Urobilinogen, UA: 1
pH, UA: 6

## 2015-08-22 NOTE — Progress Notes (Signed)
Pt presented to office for blood pressure check and urine microscopic due to blood in urine. Pt denies headaches and no blood noticed in urine anymore.

## 2015-08-23 LAB — URINALYSIS, MICROSCOPIC ONLY: RBC / HPF: NONE SEEN (ref 0–?)

## 2015-08-24 ENCOUNTER — Telehealth: Payer: Self-pay | Admitting: Family Medicine

## 2015-08-24 NOTE — Telephone Encounter (Signed)
I called the pt and informed him I was calling in regards to his blood pressure reading from the nurse visit last week.  He is aware his wife will call back for the follow up visit in 2-4 weeks with Dr Maudie Mercury.

## 2015-08-24 NOTE — Telephone Encounter (Signed)
Pt would like UA results. Pt would like callback around 330 pm

## 2015-09-20 ENCOUNTER — Other Ambulatory Visit: Payer: BLUE CROSS/BLUE SHIELD

## 2015-10-12 ENCOUNTER — Encounter: Payer: BLUE CROSS/BLUE SHIELD | Admitting: Family Medicine

## 2015-12-05 DIAGNOSIS — R310 Gross hematuria: Secondary | ICD-10-CM | POA: Diagnosis not present

## 2015-12-05 DIAGNOSIS — K4090 Unilateral inguinal hernia, without obstruction or gangrene, not specified as recurrent: Secondary | ICD-10-CM | POA: Diagnosis not present

## 2015-12-05 DIAGNOSIS — Z125 Encounter for screening for malignant neoplasm of prostate: Secondary | ICD-10-CM | POA: Diagnosis not present

## 2016-01-14 DIAGNOSIS — R310 Gross hematuria: Secondary | ICD-10-CM | POA: Diagnosis not present

## 2016-01-14 DIAGNOSIS — R319 Hematuria, unspecified: Secondary | ICD-10-CM | POA: Diagnosis not present

## 2016-01-22 ENCOUNTER — Encounter: Payer: Self-pay | Admitting: Family Medicine

## 2016-01-22 DIAGNOSIS — I700 Atherosclerosis of aorta: Secondary | ICD-10-CM | POA: Insufficient documentation

## 2016-01-25 DIAGNOSIS — R310 Gross hematuria: Secondary | ICD-10-CM | POA: Diagnosis not present

## 2016-02-06 DIAGNOSIS — M7061 Trochanteric bursitis, right hip: Secondary | ICD-10-CM | POA: Diagnosis not present

## 2016-09-02 ENCOUNTER — Encounter: Payer: Self-pay | Admitting: Adult Health

## 2016-09-02 ENCOUNTER — Other Ambulatory Visit: Payer: Self-pay | Admitting: *Deleted

## 2016-09-02 ENCOUNTER — Ambulatory Visit (INDEPENDENT_AMBULATORY_CARE_PROVIDER_SITE_OTHER): Payer: BLUE CROSS/BLUE SHIELD | Admitting: Adult Health

## 2016-09-02 VITALS — BP 122/82 | HR 87 | Temp 98.6°F | Ht 70.0 in | Wt 208.8 lb

## 2016-09-02 DIAGNOSIS — D1721 Benign lipomatous neoplasm of skin and subcutaneous tissue of right arm: Secondary | ICD-10-CM | POA: Diagnosis not present

## 2016-09-02 MED ORDER — LISINOPRIL-HYDROCHLOROTHIAZIDE 20-25 MG PO TABS: 1.0000 | ORAL_TABLET | Freq: Every day | ORAL | 0 refills | Status: DC

## 2016-09-02 NOTE — Patient Instructions (Addendum)
It was great meeting you today .   Your exam is consistent with a lipoma. This is a non cancerous growth.     Lipoma A lipoma is a noncancerous (benign) tumor that is made up of fat cells. This is a very common type of soft-tissue growth. Lipomas are usually found under the skin (subcutaneous). They may occur in any tissue of the body that contains fat. Common areas for lipomas to appear include the back, shoulders, buttocks, and thighs. Lipomas grow slowly, and they are usually painless. Most lipomas do not cause problems and do not require treatment. What are the causes? The cause of this condition is not known. What increases the risk? This condition is more likely to develop in:  People who are 16-28 years old.  People who have a family history of lipomas.  What are the signs or symptoms? A lipoma usually appears as a small, round bump under the skin. It may feel soft or rubbery, but the firmness can vary. Most lipomas are not painful. However, a lipoma may become painful if it is located in an area where it pushes on nerves. How is this diagnosed? A lipoma can usually be diagnosed with a physical exam. You may also have tests to confirm the diagnosis and to rule out other conditions. Tests may include:  Imaging tests, such as a CT scan or MRI.  Removal of a tissue sample to be looked at under a microscope (biopsy).  How is this treated? Treatment is not needed for small lipomas that are not causing problems. If a lipoma continues to get bigger or it causes problems, removal is often the best option. Lipomas can also be removed to improve appearance. Removal of a lipoma is usually done with a surgery in which the fatty cells and the surrounding capsule are removed. Most often, a medicine that numbs the area (local anesthetic) is used for this procedure. Follow these instructions at home:  Keep all follow-up visits as directed by your health care provider. This is important. Contact  a health care provider if:  Your lipoma becomes larger or hard.  Your lipoma becomes painful, red, or increasingly swollen. These could be signs of infection or a more serious condition. This information is not intended to replace advice given to you by your health care provider. Make sure you discuss any questions you have with your health care provider. Document Released: 01/31/2002 Document Revised: 07/19/2015 Document Reviewed: 02/06/2014 Elsevier Interactive Patient Education  [employer].

## 2016-09-02 NOTE — Telephone Encounter (Signed)
Rx done. 

## 2016-09-02 NOTE — Progress Notes (Signed)
   Subjective:    Patient ID: Brian Barrett, male    DOB: Dec 18, 1960, 56 y.o.   MRN: [id number]  HPI  56 year old male who  has a past medical history of Hypertension and Tobacco abuse (05/26/2014). He presents to the office today for a concern of a " lump in my right armpit". He had not noticed it until his wife found it this morning and she wanted him to get it checked out.   He denies any pain, redness or drainage    Review of Systems See HPI   Past Medical History:  Diagnosis Date  . Hypertension   . Tobacco abuse 05/26/2014    Social History   Social History  . Marital status: Married    Spouse name: N/A  . Number of children: N/A  . Years of education: N/A   Occupational History  . Not on file.   Social History Main Topics  . Smoking status: Current Every Day Smoker    Packs/day: 0.50    Types: Cigarettes  . Smokeless tobacco: Never Used  . Alcohol use 0.0 oz/week     Comment: daily intake of 2-3 liquor drinks and 3 beers daily  . Drug use: No  . Sexual activity: Not on file   Other Topics Concern  . 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 HAND SURGERY    . HERNIA REPAIR      Family History  Problem Relation Age of Onset  . Other Mother        deceased  . Bone cancer Paternal Uncle     No Known Allergies  No current outpatient prescriptions on file prior to visit.   No current facility-administered medications on file prior to visit.     BP 122/82   Pulse 87   Temp 98.6 F (37 C) (Oral)   Ht 5\' 10"  (1.778 m)   Wt 208 lb 12.8 oz (94.7 kg)   BMI 29.96 kg/m       Objective:   Physical Exam  Constitutional: He is oriented to person, place, and time. He appears well-developed and well-nourished. No  distress.  Neurological: He is alert and oriented to person, place, and time.  Skin: Skin is warm and dry. No rash noted. He is not diaphoretic. No erythema. No pallor.  Quarter sized moveable, non tender mass in right axilla.    Psychiatric: He has a normal mood and affect. His behavior is normal. Judgment and thought content normal.  Nursing note and vitals reviewed.     Assessment & Plan:  1. Lipoma of right upper extremity - Consistent with lipoma  - Options reviewed with patient and he will opt for watchful waiting  - Follow up with PCP as needed  Dorothyann Peng, NP

## 2016-12-23 DIAGNOSIS — H35721 Serous detachment of retinal pigment epithelium, right eye: Secondary | ICD-10-CM | POA: Diagnosis not present

## 2017-03-05 ENCOUNTER — Other Ambulatory Visit: Payer: Self-pay | Admitting: Family Medicine

## 2017-04-06 ENCOUNTER — Other Ambulatory Visit: Payer: Self-pay | Admitting: Family Medicine

## 2017-04-09 ENCOUNTER — Ambulatory Visit (INDEPENDENT_AMBULATORY_CARE_PROVIDER_SITE_OTHER): Payer: BLUE CROSS/BLUE SHIELD | Admitting: Family Medicine

## 2017-04-09 ENCOUNTER — Encounter: Payer: Self-pay | Admitting: Family Medicine

## 2017-04-09 VITALS — BP 140/80 | HR 87 | Temp 98.7°F | Ht 70.0 in | Wt 207.5 lb

## 2017-04-09 DIAGNOSIS — J069 Acute upper respiratory infection, unspecified: Secondary | ICD-10-CM | POA: Diagnosis not present

## 2017-04-09 MED ORDER — BENZONATATE 100 MG PO CAPS: 100.0000 mg | ORAL_CAPSULE | Freq: Two times a day (BID) | ORAL | 0 refills | Status: DC | PRN

## 2017-04-09 NOTE — Progress Notes (Signed)
HPI:  Acute visit for respiratory illness: -started: 3 days -symptoms:nasal congestion, sore throat, cough -denies:fever, SOB, NVD, tooth pain, wheezing, malaise better nice thank you -has tried: OTC cold meds -sick contacts/travel/risks: no reported flu, strep or tick exposure, multiple folks at work with similar symptoms  ROS: See pertinent positives and negatives per HPI.  Past Medical History:  Diagnosis Date  . Hypertension   . Tobacco abuse 05/26/2014    Past Surgical History:  Procedure Laterality Date  . HAND SURGERY    . HERNIA REPAIR      Family History  Problem Relation Age of Onset  . Other Mother        deceased  . Bone cancer Paternal Uncle     Social History   Socioeconomic History  . Marital status: Married    Spouse name: None  . Number of children: None  . Years of education: None  . Highest education level: None  Social Needs  . Financial resource strain: None  . Food insecurity - worry: None  . Food insecurity - inability: None  . Transportation needs - medical: None  . Transportation needs - non-medical: None  Occupational History  . None  Tobacco Use  . Smoking status: Current Every Day Smoker    Packs/day: 0.50    Types: Cigarettes  . Smokeless tobacco: Never Used  Substance and Sexual Activity  . Alcohol use: Yes    Alcohol/week: 0.0 oz    Comment: daily intake of 2-3 liquor drinks and 3 beers daily  . Drug use: No  . Sexual activity: None  Other Topics Concern  . 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  lisinopril-hydrochlorothiazide (PRINZIDE,ZESTORETIC) 20-25 MG tablet, TAKE 1 TABLET BY MOUTH EVERY DAY. NEED APPOINTMENT, Disp: 15 tablet, Rfl: 0 .  benzonatate (TESSALON) 100 MG capsule, Take 1 capsule (100 mg  total) by mouth 2 (two) times daily as needed for cough., Disp: 20 capsule, Rfl: 0  EXAM:  Vitals:   04/09/17 1712  BP: 140/80  Pulse: 87  Temp: 98.7 F (37.1 C)  SpO2: 97%    Body mass index is 29.77 kg/m.  GENERAL: vitals reviewed and listed above, alert, oriented, appears well hydrated and in no acute distress  HEENT: atraumatic, conjunttiva clear, no obvious abnormalities on inspection of external nose and ears, normal appearance of ear canals and TMs, clear nasal congestion, mild post oropharyngeal erythema with PND, no tonsillar edema or exudate, no sinus TTP  NECK: no obvious masses on inspection  LUNGS: clear to auscultation bilaterally, no wheezes, rales or rhonchi, good air movement  CV: HRRR, no peripheral edema  MS: moves all extremities without noticeable abnormality  PSYCH: pleasant and cooperative, no obvious depression or anxiety  ASSESSMENT AND PLAN:  Discussed the following assessment and plan:  Viral upper respiratory illness  -given HPI and exam findings today, a serious infection or illness is unlikely. We discussed potential etiologies, with VURI being most likely, and advised supportive care and monitoring. We discussed treatment side effects, likely course, antibiotic misuse, transmission, and signs of developing a serious illness. -tessalon for cough after discussion risks -Discussed potential for flu, he opted against testing given mild symptoms and  out of window for likely benefit > risks for tamiflu -needs follow up BP when not sick, Labs CPE - he agrees to schedule -of course, we advised to return or notify a doctor immediately if symptoms worsen or persist or new concerns arise.    Patient Instructions  BEFORE YOU LEAVE: -follow up: physical with labs in next 3-4 weeks   INSTRUCTIONS FOR UPPER RESPIRATORY INFECTION:  -plenty of rest and fluids  -nasal saline wash 2-3 times daily (use prepackaged nasal saline or bottled/distilled water  if making your own)   -can use AFRIN nasal spray for drainage and nasal congestion - but do NOT use longer then 3-4 days  -can use tylenol (in no history of liver disease) or ibuprofen (if no history of kidney disease, bowel bleeding or significant heart disease) as directed for aches and sorethroat  -in the winter time, using a humidifier at night is helpful (please follow cleaning instructions)  -if you are taking a cough medication - use only as directed, may also try a teaspoon of honey to coat the throat and throat lozenges. I sent Tessalon to the pharmacy.  -for sore throat, salt water gargles can help  -follow up if you have fevers, facial pain, tooth pain, difficulty breathing or are worsening or symptoms persist longer then expected  Upper Respiratory Infection, Adult An upper respiratory infection (URI) is also known as the common cold. It is often caused by a type of germ (virus). Colds are easily spread (contagious). You can pass it to others by kissing, coughing, sneezing, or drinking out of the same glass. Usually, you get better in 1 to 3  weeks.  However, the cough can last for even longer. HOME CARE   Only take medicine as told by your doctor. Follow instructions provided above.  Drink enough water and fluids to keep your pee (urine) clear or pale yellow.  Get plenty of rest.  Return to work when your temperature is < 100 for 24 hours or as told by your doctor. You may use a face mask and wash your hands to stop your cold from spreading. GET HELP RIGHT AWAY IF:   After the first few days, you feel you are getting worse.  You have questions about your medicine.  You have chills, shortness of breath, or red spit (mucus).  You have pain in the face for more then 1-2 days, especially when you bend forward.  You have a fever, puffy (swollen) neck, pain when you swallow, or white spots in the back of your throat.  You have a bad headache, ear pain, sinus pain, or chest  pain.  You have a high-pitched whistling sound when you breathe in and out (wheezing).  You cough up blood.  You have sore muscles or a stiff neck. MAKE SURE YOU:   Understand these instructions.  Will watch your condition.  Will get help right away if you are not doing well or get worse. Document Released: 07/30/2007 Document Revised: 05/05/2011 Document Reviewed: 05/18/2013 [facility] Patient Information 2015 [place], [place]. This information is not intended to replace advice given to you by your health care provider. Make sure you discuss any questions you have with your health care provider.     Lucretia Kern, DO

## 2017-04-09 NOTE — Patient Instructions (Signed)
BEFORE YOU LEAVE: -follow up: physical with labs in next 3-4 weeks   INSTRUCTIONS FOR UPPER RESPIRATORY INFECTION:  -plenty of rest and fluids  -nasal saline wash 2-3 times daily (use prepackaged nasal saline or bottled/distilled water if making your own)   -can use AFRIN nasal spray for drainage and nasal congestion - but do NOT use longer then 3-4 days  -can use tylenol (in no history of liver disease) or ibuprofen (if no history of kidney disease, bowel bleeding or significant heart disease) as directed for aches and sorethroat  -in the winter time, using a humidifier at night is helpful (please follow cleaning instructions)  -if you are taking a cough medication - use only as directed, may also try a teaspoon of honey to coat the throat and throat lozenges. I sent Tessalon to the pharmacy.  -for sore throat, salt water gargles can help  -follow up if you have fevers, facial pain, tooth pain, difficulty breathing or are worsening or symptoms persist longer then expected  Upper Respiratory Infection, Adult An upper respiratory infection (URI) is also known as the common cold. It is often caused by a type of germ (virus). Colds are easily spread (contagious). You can pass it to others by kissing, coughing, sneezing, or drinking out of the same glass. Usually, you get better in 1 to 3  weeks.  However, the cough can last for even longer. HOME CARE   Only take medicine as told by your doctor. Follow instructions provided above.  Drink enough water and fluids to keep your pee (urine) clear or pale yellow.  Get plenty of rest.  Return to work when your temperature is < 100 for 24 hours or as told by your doctor. You may use a face mask and wash your hands to stop your cold from spreading. GET HELP RIGHT AWAY IF:   After the first few days, you feel you are getting worse.  You have questions about your medicine.  You have chills, shortness of breath, or red spit (mucus).  You  have pain in the face for more then 1-2 days, especially when you bend forward.  You have a fever, puffy (swollen) neck, pain when you swallow, or white spots in the back of your throat.  You have a bad headache, ear pain, sinus pain, or chest pain.  You have a high-pitched whistling sound when you breathe in and out (wheezing).  You cough up blood.  You have sore muscles or a stiff neck. MAKE SURE YOU:   Understand these instructions.  Will watch your condition.  Will get help right away if you are not doing well or get worse. Document Released: 07/30/2007 Document Revised: 05/05/2011 Document Reviewed: 05/18/2013 [facility] Patient Information 2015 [place], [place]. This information is not intended to replace advice given to you by your health care provider. Make sure you discuss any questions you have with your health care provider.

## 2017-04-23 ENCOUNTER — Telehealth: Payer: Self-pay | Admitting: Family Medicine

## 2017-04-23 NOTE — Telephone Encounter (Signed)
Copied from [place]. Topic: Quick Communication - See Telephone Encounter >> Apr 23, 2017 11:53 AM Burnis Medin, NT wrote: CRM for notification. See Telephone encounter for: Patient wife called and said pt was seen a couple of weeks ago and is still having symptoms of cough and congestion. Wife wanted to know if something can be called in to [place], [place] [phone] (Phone) [phone] (Fax)    04/23/17.

## 2017-04-24 NOTE — Telephone Encounter (Signed)
Called pt. Back- unable to leave message - the mailbox is full.

## 2017-05-11 NOTE — Progress Notes (Addendum)
HPI:  Using dictation device. Unfortunately this device frequently misinterprets words/phrases.  Here for CPE: Due for labs, hepatitis C screening, question flu vaccine-refused in the past -Concerns and/or follow up today: none Past medical history significant for hypertension, aortic atherosclerosis, obesity.  He has not been in for a physical in some time.  Reports he has had postnasal drip and a cough for several weeks.  Occasionally feels a little wheezy.  Continues to smoke about half pack to a pack per day.  He is not ready to quit, but he will consider it.  He did not take his blood pressure medicine this morning because he is fasting.  He saw the urologist about the hematuria and had an extensive evaluation.  This all turned out negative per his report  -Diet: variety of foods, balance and well rounded, larger portion sizes -Exercise: no regular exercise -Diabetes and Dyslipidemia Screening: Fasting for labs -Hx of HTN: no -Vaccines: UTD -sexual activity: yes, male partner, no new partners -wants STI testing, Hep C screening (if born 61-1963): no, but agrees to hepatitis C screening -FH colon or prstate ca: see FH Last colon cancer screening: Reports he had a colonoscopy at age 37 and was told to repeat in 5 years secondary to family history of colon cancer in his brother. We do not have this report in our system.  Advised my assistant to call gastroenterology C fall for recommendations and to send the report to scan. --> assistant obtained colonoscopy report, Dr.Mann, Jul 11, 2014, tubular adenoma with repeat in 5 years advise. Assistant to update pt and change in HM section/abstract.  Last prostate ca screening: Reports he did this with his urologist, but he would like to do a PSA today.  Discussed risk and benefits.  He declined a digital rectal exam.   -Alcohol, Tobacco, drug use: see social history  Review of Systems - no fevers, unintentional weight loss, vision loss,  hearing loss, chest pain, sob, hemoptysis, melena, hematochezia, hematuria, genital discharge, changing or concerning skin lesions, bleeding, bruising, loc, thoughts of self harm or SI  Past Medical History:  Diagnosis Date  . Hypertension   . Tobacco abuse 05/26/2014    Past Surgical History:  Procedure Laterality Date  . HAND SURGERY    . HERNIA REPAIR      Family History  Problem Relation Age of Onset  . Other Mother        deceased  . Bone cancer Paternal Uncle     Social History   Socioeconomic History  . Marital status: Married    Spouse name: None  . Number of children: None  . Years of education: None  . Highest education level: None  Social Needs  . Financial resource strain: None  . Food insecurity - worry: None  . Food insecurity - inability: None  . Transportation needs - medical: None  . Transportation needs - non-medical: None  Occupational History  . None  Tobacco Use  . Smoking status: Current Every Day Smoker    Packs/day: 0.50    Types: Cigarettes  . Smokeless tobacco: Never Used  Substance and Sexual Activity  . Alcohol use: Yes    Alcohol/week: 0.0 oz    Comment: daily intake of 2-3 liquor drinks and 3 beers daily  . Drug use: No  . Sexual activity: None  Other Topics Concern  . 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  lisinopril-hydrochlorothiazide (PRINZIDE,ZESTORETIC) 20-25 MG tablet, TAKE 1 TABLET BY MOUTH EVERY DAY. NEED APPOINTMENT, Disp: 15 tablet, Rfl: 0  EXAM:  Vitals:   05/12/17 1009  BP: 132/80  Pulse: 79  Temp: 98.6 F (37 C)  TempSrc: Oral  Weight: 204 lb 4.8 oz (92.7 kg)  Height: _0  (1.778 m)    Estimated body mass index is 29.31 kg/m as calculated from the following:   Height as of this encounter:  _1  (1.778 m).   Weight as of this encounter: 204 lb 4.8 oz (92.7 kg).  GENERAL: vitals reviewed and listed below, alert, oriented, appears well hydrated and in no acute distress  HEENT: head atraumatic, PERRLA, normal appearance of eyes, ears, nose and mouth. moist mucus membranes.  Poor oral hygiene, dental caries.  Postnasal drip.  Boggy pale mucous membranes in the nose.  NECK: supple, no masses or lymphadenopathy  LUNGS: clear to auscultation bilaterally, no rales, rhonchi or wheeze  CV: HRRR, no peripheral edema or cyanosis, normal pedal pulses  ABDOMEN: bowel sounds normal, soft, non tender to palpation, no masses, no rebound or guarding  GU: Declined  SKIN: no rash or abnormal lesions  MS: normal gait, moves all extremities normally  NEURO: normal gait, speech and thought processing grossly intact, muscle tone grossly intact throughout  PSYCH: normal affect, pleasant and cooperative  ASSESSMENT AND PLAN:  Discussed the following assessment and plan:  PREVENTIVE EXAM: -Discussed and advised all [place] preventive services health task force level A and B recommendations for age, sex and risks. -Advised at least 150 minutes of exercise per week and a healthy diet  -labs, studies and vaccines per orders this encounter - Hemoglobin A1c - Lipid panel - PSA -discussed prostate cancer screening, risk and benefits, he opted for PSA, declined DRE -assi -assistant obtained colonoscopy report, Dr.Mann, Jul 11, 2014, tubular adenoma with repeat in 5 years advise. Assistant to update pt and change in HM section/abstract.  2. Screening for depression -Negative  3. Encounter for hepatitis C virus screening test for high risk patient - Hepatitis C antibody  4. Tobacco abuse -Counseled for about 5 minutes, advised to quit, advised of health risks, assess motivation and readiness to quit and discussed options to help him with smoking cessation -He is not ready to quit at this time,  but he agrees to consider,information for the quit line provided, follow-up in 1-2 months -Advised good oral exam given he is a smoker, dental contact information provided for several offices in town  5. Overweight/BMI 29.0-29.9,adult -Lifestyle recommendations advised -Advised a healthy Mediterranean style diet regular exercise  6. Aortic atherosclerosis (HCC) -Risk reduction advised, checking labs today, healthy diet, regular exercise, smoking cessation, consideration of statin  7. Cough -Coronary allergies, start Flonase and Claritin -he has had trouble with allergies in the past - DG Chest 2 View; Future to exclude other -Also talked about COPD, smoker's cough Follow-up 1-2 weeks if not improving with treatment-  8. Essential hypertension -Blood pressure little high today, but he did not take his medicine this morning in preparation for fasting labs -Recheck in 1-2 months of follow-up - Basic metabolic panel - CBC   Patient Instructions  BEFORE YOU LEAVE: Mechele Claude, please obtain his last colonoscopy report and recommendations for repeat from  his gastroenterologist, update and health maintenance and notify patient -Dental work's contact information -X-ray sheet -Labs -follow up: 1-2 months  Please call the [place] quit line to assist with helping you to prepare to quit smoking.  I recommend that you quit smoking as soon as possible.  Call today to set up an appointment with the dentist for a good oral exam.  Go get the chest x-ray.  Start Flonase 2 sprays each nostril daily for 1 month along with Claritin or Zyrtec daily.  Call in 2 weeks if your cough persist.  We have ordered labs or studies at this visit. It can take up to 1-2 weeks for results and processing. IF results require follow up or explanation, we will call you with instructions. Clinically stable results will be released to your [facility]. If you have not heard from [place] or cannot find your results in [facility]  in 2 weeks please contact our office at [phone].  If you are not yet signed up for [facility], please consider signing up.   Preventive Care 40-64 Years, Male Preventive care refers to lifestyle choices and visits with your health care provider that can promote health and wellness. What does preventive care include?  A yearly physical exam. This is also called an annual well check.  Dental exams once or twice a year.  Routine eye exams. Ask your health care provider how often you should have your eyes checked.  Personal lifestyle choices, including: ? Daily care of your teeth and gums. ? Regular physical activity. ? Eating a healthy diet. ? Avoiding tobacco and drug use. ? Limiting alcohol use. ? Practicing safe sex. ? Taking low-dose aspirin every day starting at age 107. What happens during an annual well check? The services and screenings done by your health care provider during your annual well check will depend on your age, overall health, lifestyle risk factors, and family history of disease. Counseling Your health care provider may ask you questions about your:  Alcohol use.  Tobacco use.  Drug use.  Emotional well-being.  Home and relationship well-being.  Sexual activity.  Eating habits.  Work and work Statistician.  Screening You may have the following tests or measurements:  Height, weight, and BMI.  Blood pressure.  Lipid and cholesterol levels. These may be checked every 5 years, or more frequently if you are over 5 years old.  Skin check.  Lung cancer screening. You may have this screening every year starting at age 74 if you have a 30-pack-year history of smoking and currently smoke or have quit within the past 15 years.  Fecal occult blood test (FOBT) of the stool. You may have this test every year starting at age 58.  Flexible sigmoidoscopy or colonoscopy. You may have a sigmoidoscopy every 5 years or a colonoscopy every 10 years starting at  age 35.  Prostate cancer screening. Recommendations will vary depending on your family history and other risks.  Hepatitis C blood test.  Hepatitis B blood test.  Sexually transmitted disease (STD) testing.  Diabetes screening. This is done by checking your blood sugar (glucose) after you have not eaten for a while (fasting). You may have this done every 1-3 years.  Discuss your test results, treatment options, and if necessary, the need for more tests with your health care provider. Vaccines Your health care provider may recommend certain vaccines, such as:  Influenza vaccine. This is recommended every year.  Tetanus, diphtheria, and acellular pertussis (Tdap, Td) vaccine. You may  need a Td booster every 10 years.  Varicella vaccine. You may need this if you have not been vaccinated.  Zoster vaccine. You may need this after age 49.  Measles, mumps, and rubella (MMR) vaccine. You may need at least one dose of MMR if you were born in 1957 or later. You may also need a second dose.  Pneumococcal 13-valent conjugate (PCV13) vaccine. You may need this if you have certain conditions and have not been vaccinated.  Pneumococcal polysaccharide (PPSV23) vaccine. You may need one or two doses if you smoke cigarettes or if you have certain conditions.  Meningococcal vaccine. You may need this if you have certain conditions.  Hepatitis A vaccine. You may need this if you have certain conditions or if you travel or work in places where you may be exposed to hepatitis A.  Hepatitis B vaccine. You may need this if you have certain conditions or if you travel or work in places where you may be exposed to hepatitis B.  Haemophilus influenzae type b (Hib) vaccine. You may need this if you have certain risk factors.  Talk to your health care provider about which screenings and vaccines you need and how often you need them. This information is not intended to replace advice given to you by your  health care provider. Make sure you discuss any questions you have with your health care provider. Document Released: 03/09/2015 Document Revised: 10/31/2015 Document Reviewed: 12/12/2014 Elsevier Interactive Patient Education  2018 [employer].         No Follow-up on file.   Lucretia Kern, DO

## 2017-05-12 ENCOUNTER — Encounter: Payer: Self-pay | Admitting: Family Medicine

## 2017-05-12 ENCOUNTER — Encounter: Payer: Self-pay | Admitting: *Deleted

## 2017-05-12 ENCOUNTER — Ambulatory Visit (INDEPENDENT_AMBULATORY_CARE_PROVIDER_SITE_OTHER): Payer: BLUE CROSS/BLUE SHIELD | Admitting: Family Medicine

## 2017-05-12 VITALS — BP 132/80 | HR 79 | Temp 98.6°F | Ht 70.0 in | Wt 204.3 lb

## 2017-05-12 DIAGNOSIS — Z6829 Body mass index (BMI) 29.0-29.9, adult: Secondary | ICD-10-CM | POA: Diagnosis not present

## 2017-05-12 DIAGNOSIS — R972 Elevated prostate specific antigen [PSA]: Secondary | ICD-10-CM | POA: Insufficient documentation

## 2017-05-12 DIAGNOSIS — Z1331 Encounter for screening for depression: Secondary | ICD-10-CM | POA: Diagnosis not present

## 2017-05-12 DIAGNOSIS — E663 Overweight: Secondary | ICD-10-CM | POA: Diagnosis not present

## 2017-05-12 DIAGNOSIS — Z720 Tobacco use: Secondary | ICD-10-CM

## 2017-05-12 DIAGNOSIS — Z9189 Other specified personal risk factors, not elsewhere classified: Secondary | ICD-10-CM | POA: Diagnosis not present

## 2017-05-12 DIAGNOSIS — Z0000 Encounter for general adult medical examination without abnormal findings: Secondary | ICD-10-CM

## 2017-05-12 DIAGNOSIS — Z0001 Encounter for general adult medical examination with abnormal findings: Secondary | ICD-10-CM | POA: Diagnosis not present

## 2017-05-12 DIAGNOSIS — R059 Cough, unspecified: Secondary | ICD-10-CM

## 2017-05-12 DIAGNOSIS — Z1159 Encounter for screening for other viral diseases: Secondary | ICD-10-CM | POA: Diagnosis not present

## 2017-05-12 DIAGNOSIS — I10 Essential (primary) hypertension: Secondary | ICD-10-CM | POA: Diagnosis not present

## 2017-05-12 DIAGNOSIS — Z125 Encounter for screening for malignant neoplasm of prostate: Secondary | ICD-10-CM

## 2017-05-12 DIAGNOSIS — I700 Atherosclerosis of aorta: Secondary | ICD-10-CM | POA: Diagnosis not present

## 2017-05-12 DIAGNOSIS — R05 Cough: Secondary | ICD-10-CM | POA: Diagnosis not present

## 2017-05-12 LAB — LIPID PANEL
Cholesterol: 127 mg/dL (ref 0–200)
HDL: 60.8 mg/dL (ref >39.00)
LDL Cholesterol: 55 mg/dL (ref 0–99)
NonHDL: 66.08
Total CHOL/HDL Ratio: 2
Triglycerides: 57 mg/dL (ref 0.0–149.0)
VLDL: 11.4 mg/dL (ref 0.0–40.0)

## 2017-05-12 LAB — HEMOGLOBIN A1C: Hgb A1c MFr Bld: 6 % (ref 4.6–6.5)

## 2017-05-12 LAB — BASIC METABOLIC PANEL
BUN: 9 mg/dL (ref 6–23)
CO2: 29 mEq/L (ref 19–32)
Calcium: 9.4 mg/dL (ref 8.4–10.5)
Chloride: 103 mEq/L (ref 96–112)
Creatinine, Ser: 0.79 mg/dL (ref 0.40–1.50)
GFR: 131.14 mL/min (ref >60.00)
Glucose, Bld: 78 mg/dL (ref 70–99)
Potassium: 4.1 mEq/L (ref 3.5–5.1)
Sodium: 140 mEq/L (ref 135–145)

## 2017-05-12 LAB — CBC
HCT: 44.6 % (ref 39.0–52.0)
Hemoglobin: 15.7 g/dL (ref 13.0–17.0)
MCHC: 35.1 g/dL (ref 30.0–36.0)
MCV: 96.8 fl (ref 78.0–100.0)
Platelets: 197 10*3/uL (ref 150.0–400.0)
RBC: 4.61 Mil/uL (ref 4.22–5.81)
RDW: 13.9 % (ref 11.5–15.5)
WBC: 6.4 10*3/uL (ref 4.0–10.5)

## 2017-05-12 LAB — PSA: PSA: 4.44 ng/mL — ABNORMAL HIGH (ref 0.10–4.00)

## 2017-05-12 NOTE — Patient Instructions (Signed)
BEFORE YOU LEAVE: Brian Barrett, please obtain his last colonoscopy report and recommendations for repeat from his gastroenterologist, update and health maintenance and notify patient -Dental work's contact information -X-ray sheet -Labs -follow up: 1-2 months  Please call the [place] quit line to assist with helping you to prepare to quit smoking.  I recommend that you quit smoking as soon as possible.  Call today to set up an appointment with the dentist for a good oral exam.  Go get the chest x-ray.  Start Flonase 2 sprays each nostril daily for 1 month along with Claritin or Zyrtec daily.  Call in 2 weeks if your cough persist.  We have ordered labs or studies at this visit. It can take up to 1-2 weeks for results and processing. IF results require follow up or explanation, we will call you with instructions. Clinically stable results will be released to your [facility]. If you have not heard from [place] or cannot find your results in [facility] in 2 weeks please contact our office at [phone].  If you are not yet signed up for [facility], please consider signing up.   Preventive Care 40-64 Years, Male Preventive care refers to lifestyle choices and visits with your health care provider that can promote health and wellness. What does preventive care include?  A yearly physical exam. This is also called an annual well check.  Dental exams once or twice a year.  Routine eye exams. Ask your health care provider how often you should have your eyes checked.  Personal lifestyle choices, including: ? Daily care of your teeth and gums. ? Regular physical activity. ? Eating a healthy diet. ? Avoiding tobacco and drug use. ? Limiting alcohol use. ? Practicing safe sex. ? Taking low-dose aspirin every day starting at age 76. What happens during an annual well check? The services and screenings done by your health care provider during your annual well check will depend on your age, overall  health, lifestyle risk factors, and family history of disease. Counseling Your health care provider may ask you questions about your:  Alcohol use.  Tobacco use.  Drug use.  Emotional well-being.  Home and relationship well-being.  Sexual activity.  Eating habits.  Work and work Statistician.  Screening You may have the following tests or measurements:  Height, weight, and BMI.  Blood pressure.  Lipid and cholesterol levels. These may be checked every 5 years, or more frequently if you are over 88 years old.  Skin check.  Lung cancer screening. You may have this screening every year starting at age 18 if you have a 30-pack-year history of smoking and currently smoke or have quit within the past 15 years.  Fecal occult blood test (FOBT) of the stool. You may have this test every year starting at age 86.  Flexible sigmoidoscopy or colonoscopy. You may have a sigmoidoscopy every 5 years or a colonoscopy every 10 years starting at age 23.  Prostate cancer screening. Recommendations will vary depending on your family history and other risks.  Hepatitis C blood test.  Hepatitis B blood test.  Sexually transmitted disease (STD) testing.  Diabetes screening. This is done by checking your blood sugar (glucose) after you have not eaten for a while (fasting). You may have this done every 1-3 years.  Discuss your test results, treatment options, and if necessary, the need for more tests with your health care provider. Vaccines Your health care provider may recommend certain vaccines, such as:  Influenza vaccine. This is  recommended every year.  Tetanus, diphtheria, and acellular pertussis (Tdap, Td) vaccine. You may need a Td booster every 10 years.  Varicella vaccine. You may need this if you have not been vaccinated.  Zoster vaccine. You may need this after age 23.  Measles, mumps, and rubella (MMR) vaccine. You may need at least one dose of MMR if you were born in  1955 or later. You may also need a second dose.  Pneumococcal 13-valent conjugate (PCV13) vaccine. You may need this if you have certain conditions and have not been vaccinated.  Pneumococcal polysaccharide (PPSV23) vaccine. You may need one or two doses if you smoke cigarettes or if you have certain conditions.  Meningococcal vaccine. You may need this if you have certain conditions.  Hepatitis A vaccine. You may need this if you have certain conditions or if you travel or work in places where you may be exposed to hepatitis A.  Hepatitis B vaccine. You may need this if you have certain conditions or if you travel or work in places where you may be exposed to hepatitis B.  Haemophilus influenzae type b (Hib) vaccine. You may need this if you have certain risk factors.  Talk to your health care provider about which screenings and vaccines you need and how often you need them. This information is not intended to replace advice given to you by your health care provider. Make sure you discuss any questions you have with your health care provider. Document Released: 03/09/2015 Document Revised: 10/31/2015 Document Reviewed: 12/12/2014 Elsevier Interactive Patient Education  [employer].

## 2017-05-13 LAB — HEPATITIS C ANTIBODY
Hepatitis C Ab: NONREACTIVE
SIGNAL TO CUT-OFF: 0.02 (ref <1.00)

## 2017-05-18 ENCOUNTER — Ambulatory Visit (INDEPENDENT_AMBULATORY_CARE_PROVIDER_SITE_OTHER)
Admission: RE | Admit: 2017-05-18 | Discharge: 2017-05-18 | Disposition: A | Discharge status: Home / Self Care | Payer: BLUE CROSS/BLUE SHIELD | Source: Ambulatory Visit | Attending: Family Medicine | Admitting: Family Medicine

## 2017-05-18 DIAGNOSIS — R059 Cough, unspecified: Secondary | ICD-10-CM

## 2017-05-18 DIAGNOSIS — R05 Cough: Secondary | ICD-10-CM | POA: Diagnosis not present

## 2017-05-25 ENCOUNTER — Other Ambulatory Visit: Payer: Self-pay | Admitting: Family Medicine

## 2017-07-16 ENCOUNTER — Ambulatory Visit: Payer: BLUE CROSS/BLUE SHIELD | Admitting: Family Medicine

## 2018-04-28 ENCOUNTER — Other Ambulatory Visit: Payer: Self-pay | Admitting: Family Medicine

## 2018-04-28 MED ORDER — LISINOPRIL-HYDROCHLOROTHIAZIDE 20-25 MG PO TABS: ORAL_TABLET | ORAL | 0 refills | Status: DC

## 2018-04-28 NOTE — Telephone Encounter (Signed)
Courtesy refill. Pt has appt 05/20/2018

## 2018-04-28 NOTE — Telephone Encounter (Signed)
Copied from [place] [id number]. Topic: Quick Communication - Rx Refill/Question >> Apr 28, 2018  4:55 PM Bea Graff, NT wrote: Medication: lisinopril-hydrochlorothiazide (PRINZIDE,ZESTORETIC) 20-25 MG tablet  Has the patient contacted their pharmacy? Yes.   (Agent: If no, request that the patient contact the pharmacy for the refill.) (Agent: If yes, when and what did the pharmacy advise?)  Preferred Pharmacy (with phone number or street name): [facility] [place], [place] [address] [phone] (Phone) [phone] (Fax)    Agent: Please be advised that RX refills may take up to 3 business days. We ask that you follow-up with your pharmacy.

## 2018-05-20 ENCOUNTER — Encounter: Payer: BLUE CROSS/BLUE SHIELD | Admitting: Family Medicine

## 2018-05-27 ENCOUNTER — Ambulatory Visit (INDEPENDENT_AMBULATORY_CARE_PROVIDER_SITE_OTHER): Payer: BLUE CROSS/BLUE SHIELD | Admitting: Family Medicine

## 2018-05-27 ENCOUNTER — Encounter: Payer: Self-pay | Admitting: Family Medicine

## 2018-05-27 ENCOUNTER — Telehealth: Payer: Self-pay | Admitting: *Deleted

## 2018-05-27 ENCOUNTER — Other Ambulatory Visit: Payer: Self-pay

## 2018-05-27 DIAGNOSIS — I700 Atherosclerosis of aorta: Secondary | ICD-10-CM | POA: Diagnosis not present

## 2018-05-27 DIAGNOSIS — Z720 Tobacco use: Secondary | ICD-10-CM | POA: Diagnosis not present

## 2018-05-27 DIAGNOSIS — I10 Essential (primary) hypertension: Secondary | ICD-10-CM

## 2018-05-27 DIAGNOSIS — E6609 Other obesity due to excess calories: Secondary | ICD-10-CM

## 2018-05-27 MED ORDER — NICOTINE 7 MG/24HR TD PT24: 7.0000 mg | MEDICATED_PATCH | Freq: Every day | TRANSDERMAL | 0 refills | Status: DC

## 2018-05-27 MED ORDER — NICOTINE 14 MG/24HR TD PT24: 14.0000 mg | MEDICATED_PATCH | Freq: Every day | TRANSDERMAL | 0 refills | Status: DC

## 2018-05-27 NOTE — Telephone Encounter (Signed)
Copied from [place] [id number]. Topic: General - Other >> May 27, 2018  1:11 PM Sheran Luz wrote: Reason for CRM: Patient's wife states they never received email for webex. She is requesting a call back from [place].

## 2018-05-27 NOTE — Progress Notes (Signed)
Virtual Visit via Video Note  I connected with Brian Barrett on 05/27/18 at  4:00 PM EDT by a video enabled telemedicine application and verified that I am speaking with the correct person using two identifiers.  Location patient: home Location provider:work or home office Persons participating in the virtual visit: patient, provider, patient's spouse  I discussed the limitations of evaluation and management by telemedicine and the availability of in person appointments. The patient expressed understanding and agreed to proceed.   HPI:  Brian Barrett is a pleasant 58 yo with a PMH significant for HTN, hyperglucemia, tobacco use and obesity. Reports Wt 178 today. Has changed his diet a lot and worked to reduce weight - no sodas, no fried foods, much healthier. Had BP check at work a few months ago. Taking BP medication daily. Still smoking. Smoking 1/2 ppd. Was able to quit smoking cold [place] in the past. Not around people smoking now so well be easier. He admits needs to quit when questioned and admits is interested in quitting. He would like to try nicotine patches. No CP, SOB, HA, DOE. He has a biometric form he needs [place] to complete for work needs labs and vitals for this. They are anxious about coronavirus exposure in light of the COVID 19 pandemic.  ROS: See pertinent positives and negatives per HPI.  Past Medical History:  Diagnosis Date  . Hypertension   . Tobacco abuse 05/26/2014    Past Surgical History:  Procedure Laterality Date  . HAND SURGERY    . HERNIA REPAIR      Family History  Problem Relation Age of Onset  . Other Mother        deceased  . Bone cancer Paternal Uncle     SOCIAL HX: see hpi, smoking   Current Outpatient Medications:  .  lisinopril-hydrochlorothiazide (PRINZIDE,ZESTORETIC) 20-25 MG tablet, Take 1 tablet by mouth every day, Disp: 90 tablet, Rfl: 0 .  nicotine (NICODERM CQ - DOSED IN MG/24 HOURS) 14 mg/24hr patch, Place 1 patch (14 mg total) onto the  skin daily. Start with this dose for 6 weeks., Disp: 42 patch, Rfl: 0 .  nicotine (NICODERM CQ - DOSED IN MG/24 HR) 7 mg/24hr patch, Place 1 patch (7 mg total) onto the skin daily. Start after completing 14mg  x 6 weeks., Disp: 14 patch, Rfl: 0  EXAM:  VITALS per patient if applicable: wt 102  GENERAL: alert, oriented, appears well and in no acute distress  HEENT: atraumatic, conjunttiva clear, no obvious abnormalities on inspection of external nose and ears  NECK: normal movements of the head and neck  LUNGS: on inspection no signs of respiratory distress, breathing rate appears normal, no obvious gross SOB, gasping or wheezing  CV: no obvious cyanosis  MS: moves all visible extremities without noticeable abnormality  PSYCH/NEURO: pleasant and cooperative, no obvious depression or anxiety, speech and thought processing grossly intact  ASSESSMENT AND PLAN:  Discussed the following assessment and plan:  Essential hypertension - Plan: Basic metabolic panel, CBC  Obesity due to excess calories with serious comorbidity, unspecified classification - Plan: Hemoglobin A1c  Aortic atherosclerosis (HCC) - Plan: Lipid panel  Tobacco abuse  Congratulated and encouraged on lifestyle changes - healthy diet and regular exercise.  Advised to quit smoking and counseled for 5-10 minutes.He agrees to quit and want to try nicotine patches to assist. Follow up in 3-4 weeks.  He needs labs and vitals - will advise assistant to arrange lab/vital visit. Advised to stay in car and  call when arrives. Advised to notify our office immediately if is sick or develops any respiratory symptoms prior to this visit.  Cont current medications for BP.  He is aware I will be leaving clinical practice and is agreeable to [facility] with Dr. Ethlyn Gallery.   I discussed the assessment and treatment plan with the patient. The patient was provided an opportunity to ask questions and all were answered. The patient agreed with  the plan and demonstrated an understanding of the instructions.     Follow up instructions: Advised assistant Wendie Simmer to help patient arrange the following: -please ensure he is seeing urology about his prostate checks -fasting labs and nurse vitals visit in next 1 week - complete his biometric form with this information and stamp/mail or return to pt -webex follow up tobacco use in 3-4 weeks with Dr. Maudie Mercury -TOC with Dr. Ethlyn Gallery in 3 months   Lucretia Kern, DO

## 2018-05-27 NOTE — Telephone Encounter (Signed)
I called the pts wife and she is aware the email was re-sent.  Brian Barrett was advised how to log in to start the Webex also.

## 2018-05-28 ENCOUNTER — Telehealth: Payer: Self-pay | Admitting: *Deleted

## 2018-05-28 NOTE — Telephone Encounter (Signed)
Per office note from 4/2, I called the pts wife and scheduled a lab appt for 4/9 (note entered to see me for vitals and need form to be completed).  Mrs Manganaro stated she will call back for the follow up visit and transfer of care visit.

## 2018-06-03 ENCOUNTER — Other Ambulatory Visit: Payer: Self-pay

## 2018-06-03 ENCOUNTER — Other Ambulatory Visit (INDEPENDENT_AMBULATORY_CARE_PROVIDER_SITE_OTHER): Payer: BLUE CROSS/BLUE SHIELD

## 2018-06-03 DIAGNOSIS — E6609 Other obesity due to excess calories: Secondary | ICD-10-CM

## 2018-06-03 DIAGNOSIS — I700 Atherosclerosis of aorta: Secondary | ICD-10-CM | POA: Diagnosis not present

## 2018-06-03 DIAGNOSIS — I10 Essential (primary) hypertension: Secondary | ICD-10-CM | POA: Diagnosis not present

## 2018-06-03 LAB — LIPID PANEL
Cholesterol: 141 mg/dL (ref 0–200)
HDL: 77.6 mg/dL (ref >39.00)
LDL Cholesterol: 53 mg/dL (ref 0–99)
NonHDL: 63.02
Total CHOL/HDL Ratio: 2
Triglycerides: 52 mg/dL (ref 0.0–149.0)
VLDL: 10.4 mg/dL (ref 0.0–40.0)

## 2018-06-03 LAB — BASIC METABOLIC PANEL
BUN: 5 mg/dL — ABNORMAL LOW (ref 6–23)
CO2: 30 mEq/L (ref 19–32)
Calcium: 9.3 mg/dL (ref 8.4–10.5)
Chloride: 97 mEq/L (ref 96–112)
Creatinine, Ser: 0.69 mg/dL (ref 0.40–1.50)
GFR: 143.68 mL/min (ref >60.00)
Glucose, Bld: 81 mg/dL (ref 70–99)
Potassium: 4.2 mEq/L (ref 3.5–5.1)
Sodium: 137 mEq/L (ref 135–145)

## 2018-06-03 LAB — CBC
HCT: 45.2 % (ref 39.0–52.0)
Hemoglobin: 15.8 g/dL (ref 13.0–17.0)
MCHC: 34.9 g/dL (ref 30.0–36.0)
MCV: 98.4 fl (ref 78.0–100.0)
Platelets: 187 10*3/uL (ref 150.0–400.0)
RBC: 4.59 Mil/uL (ref 4.22–5.81)
RDW: 13.6 % (ref 11.5–15.5)
WBC: 5.9 10*3/uL (ref 4.0–10.5)

## 2018-06-03 LAB — HEMOGLOBIN A1C: Hgb A1c MFr Bld: 5.7 % (ref 4.6–6.5)

## 2018-06-25 IMAGING — DX DG CHEST 2V
2 series · 2 of 2 positions shown · non-contrast
Comparison: 04/28/2014

CLINICAL DATA: Cough and chest congestion for 1 week.  Smoker.

EXAM:
CHEST - 2 VIEW

[chest pa]
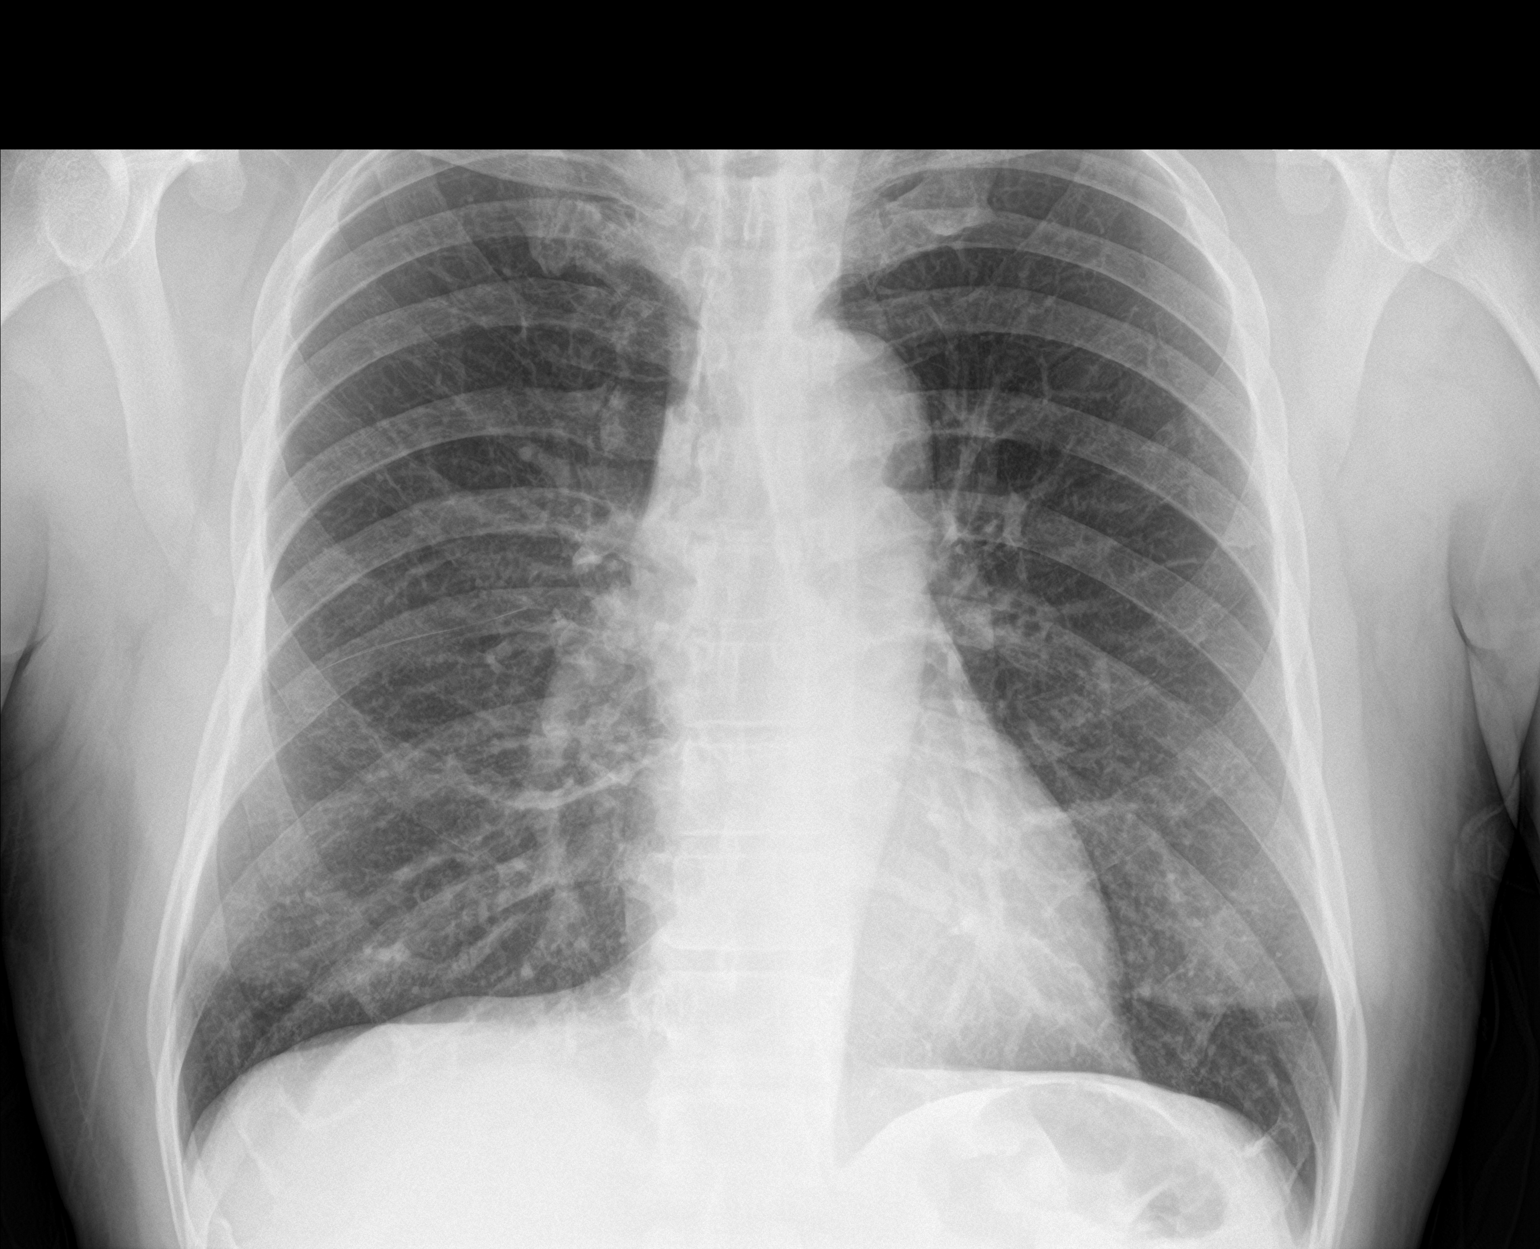

[chest lat]
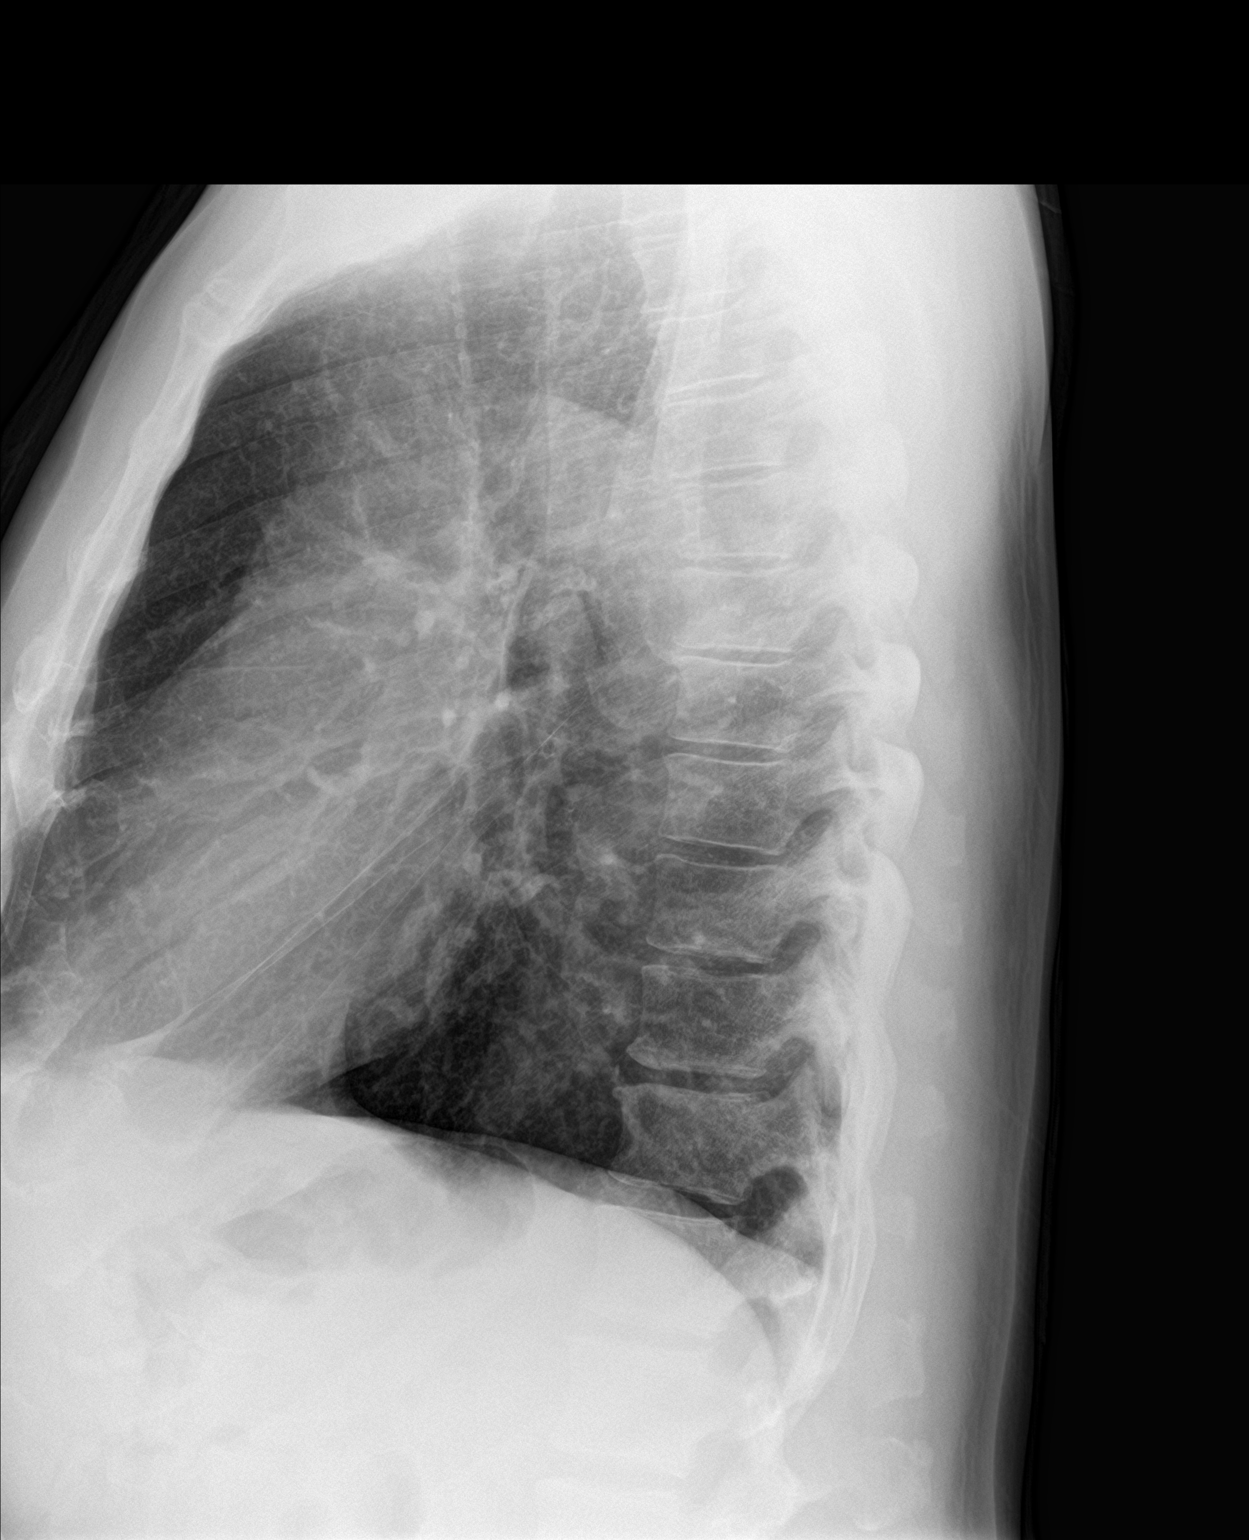

[2 of 2 positions shown; findings below may reference images not displayed]

FINDINGS: The heart size and mediastinal contours are within normal limits.
Both lungs are clear. The visualized skeletal structures are
unremarkable.
IMPRESSION: No active cardiopulmonary disease.

## 2018-08-01 ENCOUNTER — Other Ambulatory Visit: Payer: Self-pay | Admitting: Family Medicine

## 2018-10-30 ENCOUNTER — Other Ambulatory Visit: Payer: Self-pay | Admitting: Family Medicine

## 2019-02-03 ENCOUNTER — Other Ambulatory Visit: Payer: Self-pay | Admitting: Family Medicine

## 2019-04-24 ENCOUNTER — Other Ambulatory Visit: Payer: Self-pay | Admitting: Family Medicine

## 2019-05-28 ENCOUNTER — Other Ambulatory Visit: Payer: Self-pay | Admitting: Family Medicine

## 2019-06-30 ENCOUNTER — Telehealth: Payer: Self-pay | Admitting: Family Medicine

## 2019-06-30 NOTE — Telephone Encounter (Signed)
Pt's spouse, Joanne Chars (ok Per DPR), stated they need to r/s his May 10th appt to a 4pm either Monday through Friday. I informed her that the PCP only does virtual's in the afternoon and I would have to get permission in order to move his appt to the 4pm. His appt is a TOC from [place].    Joanne Chars can be reached at [phone]

## 2019-07-01 NOTE — Telephone Encounter (Signed)
Please advise 

## 2019-07-04 ENCOUNTER — Ambulatory Visit: Payer: BC Managed Care – PPO | Admitting: Family Medicine

## 2019-07-04 NOTE — Telephone Encounter (Signed)
Pt's spouse, Joanne Chars, called back to check to see if the pt can be seen at 4pm Monday-Friday. She states that he is running out of bp meds and has a hernia that needs to be checked out and would like a call back asap.   Pt orginally had an appt today 5/10 at 3pm but that time does not work for them and would like to move it to a 4pm any day of the week.

## 2019-07-04 NOTE — Telephone Encounter (Signed)
Left detailed message for patient to return call to office to schedule appointment for 4 pm on tomorrow or Wednesday.

## 2019-07-05 NOTE — Telephone Encounter (Signed)
I called the pts wife and informed her of the message below as per Daisy Lazar Dr Martinique agreed to see the pt today or tomorrow in the office due to the hernia, it was suspected that the pt may not want to await the visit on 5/24.  Brian Barrett declined the appt today for a virtual visit and in office with Dr Martinique, stated the pt is at work and he will be OK until the appt on 5/24.

## 2019-07-05 NOTE — Telephone Encounter (Signed)
Noted. FYI sent to Dr. Jordan. 

## 2019-07-05 NOTE — Telephone Encounter (Signed)
Im happy to do virtual visit for refills today or Thursday.

## 2019-07-18 ENCOUNTER — Ambulatory Visit (INDEPENDENT_AMBULATORY_CARE_PROVIDER_SITE_OTHER): Payer: BC Managed Care – PPO | Admitting: Family Medicine

## 2019-07-18 ENCOUNTER — Encounter: Payer: Self-pay | Admitting: Family Medicine

## 2019-07-18 ENCOUNTER — Other Ambulatory Visit: Payer: Self-pay

## 2019-07-18 VITALS — BP 140/80 | HR 95 | Temp 97.0°F | Resp 12 | Ht 70.0 in | Wt 181.5 lb

## 2019-07-18 DIAGNOSIS — I10 Essential (primary) hypertension: Secondary | ICD-10-CM

## 2019-07-18 DIAGNOSIS — K4090 Unilateral inguinal hernia, without obstruction or gangrene, not specified as recurrent: Secondary | ICD-10-CM | POA: Diagnosis not present

## 2019-07-18 DIAGNOSIS — Z720 Tobacco use: Secondary | ICD-10-CM | POA: Diagnosis not present

## 2019-07-18 MED ORDER — LISINOPRIL-HYDROCHLOROTHIAZIDE 20-25 MG PO TABS: 1.0000 | ORAL_TABLET | Freq: Every day | ORAL | 1 refills | Status: DC

## 2019-07-18 NOTE — Assessment & Plan Note (Signed)
We discussed adverse effects of tobacco use as well as benefits of smoking cessation.  For now he is not interested in trying pharmacologic treatment.

## 2019-07-18 NOTE — Progress Notes (Signed)
HPI: [name] is a 59 y.o. male, who is here today to establish care.  Former PCP: Dr. Maudie Barrett. Last preventive routine visit: 04/2017.  Chronic medical problems: Hypertension, elevated PSA. According to patient, he has seen urologists and work up was negative.  HTN for 10+ years. BP readings: 130's/80's. Negative for severe/frequent headache, visual changes, chest pain, dyspnea, palpitation, claudication, focal weakness, or edema. + Smoker.  Concerns today: Inguinal hernia. He had left inguinal hernia repair around 2007. For about a year he has noted bulged right inguinal area. It seems to be getting worse. Problem is constant. Production decreases in size when lying down. Occasionally he has some tenderness. He denies fever, abnormal weight loss, abdominal pain, nausea, vomiting, changes in bowel habits, or blood in the stool.  Review of Systems  Constitutional: Negative for activity change, appetite change and fatigue.  HENT: Negative for mouth sores, nosebleeds and sore throat.   Eyes: Negative for redness.  Respiratory: Negative for apnea, cough and wheezing.   Gastrointestinal: Negative for abdominal pain, nausea and vomiting.  Genitourinary: Negative for decreased urine volume, dysuria, hematuria and testicular pain.  Musculoskeletal: Negative for gait problem and myalgias.  Skin: Negative for pallor and rash.  Neurological: Negative for dizziness, syncope and facial asymmetry.  Rest see pertinent positives and negatives per HPI.  No current outpatient medications on file prior to visit.   No current facility-administered medications on file prior to visit.   Past Medical History:  Diagnosis Date  . Hypertension   . Tobacco abuse 05/26/2014   No Known Allergies  Family History  Problem Relation Age of Onset  . Other Mother        deceased  . Bone cancer Paternal Uncle    Social History   Socioeconomic History  . Marital status: Married   Spouse name: Not on file  . Number of children: Not on file  . Years of education: Not on file  . Highest education level: Not on file  Occupational History  . Not on file  Tobacco Use  . Smoking status: Current Every Day Smoker    Packs/day: 0.50    Types: Cigarettes  . Smokeless tobacco: Never Used  Substance and Sexual Activity  . Alcohol use: Yes    Alcohol/week: 0.0 standard drinks    Comment: daily intake of 2-3 liquor drinks and 3 beers daily  . Drug use: No  . Sexual activity: Not on file  Other Topics Concern  . Not on file  Social History Narrative   Work or School: Brian Barrett, Advertising copywriter Situation: lives with wife whom sees [place]      Spiritual Beliefs: believes in God      Lifestyle: not now; diet is good      Social Determinants of Radio broadcast assistant Strain:   . Difficulty of Paying Living Expenses:   Food Insecurity:   . Worried About Charity fundraiser in the Last Year:   . Arboriculturist in the Last Year:   Transportation Needs:   . Film/video editor (Medical):   Brian Barrett Kitchen Lack of Transportation (Non-Medical):   Physical Activity:   . Days of Exercise per Week:   . Minutes of Exercise per Session:   Stress:   . Feeling of Stress :   Social Connections:   . Frequency of Communication with Friends and Family:   . Frequency of Social Gatherings with Friends and  Family:   . Attends Religious Services:   . Active Member of Clubs or Organizations:   . Attends Archivist Meetings:   Brian Barrett Kitchen Marital Status:    Vitals:   07/18/19 1640  BP: 140/80  Pulse: 95  Resp: 12  Temp: (!) 97 F (36.1 C)  SpO2: 97%   Body mass index is 26.04 kg/m.  Physical Exam  Nursing note and vitals reviewed. Constitutional: He is oriented to person, place, and time. He appears well-developed. No distress.  HENT:  Head: Normocephalic and atraumatic.  Mouth/Throat: Oropharynx is clear and moist and mucous membranes are normal. Abnormal  dentition.  Eyes: Pupils are equal, round, and reactive to light. Conjunctivae are normal.  Cardiovascular: Normal rate and regular rhythm.  No murmur heard. Pulses:      Dorsalis pedis pulses are 2+ on the right side and 2+ on the left side.  Respiratory: Effort normal and breath sounds normal. No respiratory distress.  GI: Soft. He exhibits no mass. There is no hepatomegaly. There is no abdominal tenderness. A hernia is present. Hernia confirmed positive in the right inguinal area.    Musculoskeletal:        General: No edema.  Lymphadenopathy:    He has no cervical adenopathy.  Neurological: He is alert and oriented to person, place, and time. He has normal strength. No cranial nerve deficit. Gait normal.  Skin: Skin is warm. No rash noted. No erythema.  Psychiatric: He has a normal mood and affect.  Well groomed, good eye contact.   ASSESSMENT AND PLAN:  [name] was seen today for transfer of care.  Diagnoses and all orders for this visit: Orders Placed This Encounter  Procedures  . Basic metabolic panel  . Ambulatory referral to General Surgery    Right inguinal hernia Currently he is asymptomatic. Appointment with surgeon will be arranged. Clearly instructed about warning signs.  Hypertension Problem is otherwise adequately controlled based on reported BPs. Continue lisinopril-HCTZ 20-25 mg daily. Continue monitoring BP regularly and low-salt diet. Encouraged smoking cessation. Follow-up in 5 to 6 months.  Tobacco abuse We discussed adverse effects of tobacco use as well as benefits of smoking cessation.  For now he is not interested in trying pharmacologic treatment.   Return in about 6 months (around 01/18/2020) for Labs in 3 weeks. CPE in 5-6 months..   Rosio Weiss G. [place], MD  [facility]. [place] office.

## 2019-07-18 NOTE — Assessment & Plan Note (Signed)
Problem is otherwise adequately controlled based on reported BPs. Continue lisinopril-HCTZ 20-25 mg daily. Continue monitoring BP regularly and low-salt diet. Encouraged smoking cessation. Follow-up in 5 to 6 months.

## 2019-07-18 NOTE — Patient Instructions (Signed)
A few things to remember from today's visit:  No changes today. Appt with surgeon will be arraged.  Labs in 3 weeks.  If you need refills please call your pharmacy. Do not use My Chart to request refills or for acute issues that need immediate attention.    Please be sure medication list is accurate. If a new problem present, please set up appointment sooner than planned today.

## 2019-07-19 ENCOUNTER — Telehealth: Payer: Self-pay | Admitting: Family Medicine

## 2019-07-19 NOTE — Telephone Encounter (Signed)
Per Dr. Beatris Ship in 6 Months (around 01/18/20) and labs 3 weeks prior to cpe.   LVM to schedule appt

## 2019-07-21 NOTE — Telephone Encounter (Signed)
2nd attempt

## 2019-07-26 ENCOUNTER — Other Ambulatory Visit: Payer: Self-pay

## 2019-07-26 ENCOUNTER — Other Ambulatory Visit (INDEPENDENT_AMBULATORY_CARE_PROVIDER_SITE_OTHER): Payer: BC Managed Care – PPO

## 2019-07-26 DIAGNOSIS — I10 Essential (primary) hypertension: Secondary | ICD-10-CM

## 2019-07-27 LAB — BASIC METABOLIC PANEL
BUN: 7 mg/dL (ref 6–23)
CO2: 33 mEq/L — ABNORMAL HIGH (ref 19–32)
Calcium: 9.7 mg/dL (ref 8.4–10.5)
Chloride: 98 mEq/L (ref 96–112)
Creatinine, Ser: 0.77 mg/dL (ref 0.40–1.50)
GFR: 126.07 mL/min (ref >60.00)
Glucose, Bld: 87 mg/dL (ref 70–99)
Potassium: 3.8 mEq/L (ref 3.5–5.1)
Sodium: 139 mEq/L (ref 135–145)

## 2019-07-28 NOTE — Telephone Encounter (Signed)
Last attempt-closing note

## 2019-10-18 ENCOUNTER — Other Ambulatory Visit: Payer: Self-pay | Admitting: Surgery

## 2019-10-18 DIAGNOSIS — K4090 Unilateral inguinal hernia, without obstruction or gangrene, not specified as recurrent: Secondary | ICD-10-CM | POA: Diagnosis not present

## 2019-10-24 ENCOUNTER — Other Ambulatory Visit: Payer: Self-pay | Admitting: Family Medicine

## 2019-10-24 DIAGNOSIS — I10 Essential (primary) hypertension: Secondary | ICD-10-CM

## 2020-06-11 ENCOUNTER — Other Ambulatory Visit: Payer: Self-pay

## 2020-06-11 ENCOUNTER — Encounter: Payer: Self-pay | Admitting: Family Medicine

## 2020-06-11 ENCOUNTER — Ambulatory Visit (INDEPENDENT_AMBULATORY_CARE_PROVIDER_SITE_OTHER): Payer: BC Managed Care – PPO | Admitting: Family Medicine

## 2020-06-11 VITALS — BP 130/80 | HR 94 | Temp 98.4°F | Resp 16 | Ht 70.0 in | Wt 192.2 lb

## 2020-06-11 DIAGNOSIS — Z125 Encounter for screening for malignant neoplasm of prostate: Secondary | ICD-10-CM

## 2020-06-11 DIAGNOSIS — Z130 Encounter for screening for diseases of the blood and blood-forming organs and certain disorders involving the immune mechanism: Secondary | ICD-10-CM

## 2020-06-11 DIAGNOSIS — Z1211 Encounter for screening for malignant neoplasm of colon: Secondary | ICD-10-CM

## 2020-06-11 DIAGNOSIS — H1013 Acute atopic conjunctivitis, bilateral: Secondary | ICD-10-CM

## 2020-06-11 DIAGNOSIS — Z13228 Encounter for screening for other metabolic disorders: Secondary | ICD-10-CM

## 2020-06-11 DIAGNOSIS — Z0000 Encounter for general adult medical examination without abnormal findings: Secondary | ICD-10-CM | POA: Diagnosis not present

## 2020-06-11 DIAGNOSIS — Z13220 Encounter for screening for lipoid disorders: Secondary | ICD-10-CM

## 2020-06-11 DIAGNOSIS — Z1329 Encounter for screening for other suspected endocrine disorder: Secondary | ICD-10-CM

## 2020-06-11 DIAGNOSIS — I10 Essential (primary) hypertension: Secondary | ICD-10-CM

## 2020-06-11 LAB — LIPID PANEL
Cholesterol: 121 mg/dL (ref 0–200)
HDL: 72.7 mg/dL (ref >39.00)
LDL Cholesterol: 40 mg/dL (ref 0–99)
NonHDL: 48.43
Total CHOL/HDL Ratio: 2
Triglycerides: 42 mg/dL (ref 0.0–149.0)
VLDL: 8.4 mg/dL (ref 0.0–40.0)

## 2020-06-11 LAB — COMPREHENSIVE METABOLIC PANEL
ALT: 29 U/L (ref 0–53)
AST: 39 U/L — ABNORMAL HIGH (ref 0–37)
Albumin: 4 g/dL (ref 3.5–5.2)
Alkaline Phosphatase: 37 U/L — ABNORMAL LOW (ref 39–117)
BUN: 7 mg/dL (ref 6–23)
CO2: 32 mEq/L (ref 19–32)
Calcium: 8.7 mg/dL (ref 8.4–10.5)
Chloride: 102 mEq/L (ref 96–112)
Creatinine, Ser: 0.73 mg/dL (ref 0.40–1.50)
GFR: 100.84 mL/min (ref >60.00)
Glucose, Bld: 75 mg/dL (ref 70–99)
Potassium: 3.9 mEq/L (ref 3.5–5.1)
Sodium: 141 mEq/L (ref 135–145)
Total Bilirubin: 0.6 mg/dL (ref 0.2–1.2)
Total Protein: 7 g/dL (ref 6.0–8.3)

## 2020-06-11 LAB — HEMOGLOBIN A1C: Hgb A1c MFr Bld: 5.7 % (ref 4.6–6.5)

## 2020-06-11 LAB — PSA: PSA: 2.37 ng/mL (ref 0.10–4.00)

## 2020-06-11 MED ORDER — LISINOPRIL-HYDROCHLOROTHIAZIDE 20-25 MG PO TABS: 1.0000 | ORAL_TABLET | Freq: Every day | ORAL | 2 refills | Status: DC

## 2020-06-11 MED ORDER — PAZEO 0.7 % OP SOLN: 1.0000 [drp] | Freq: Every day | OPHTHALMIC | 0 refills | Status: DC

## 2020-06-11 NOTE — Progress Notes (Signed)
HPI:  Brian Barrett is a 60 y.o.male here today for his routine physical examination.  Last CPE: 05/12/17 He lives with his wife.  Regular exercise 3 or more times per week: He does not have an exercise routine but he is active at work.  He walks up to 8000 steps daily. Following a healthy diet: He cooks at home, salads 2 times week, low fat diet, and plenty of vegetables.  Chronic medical problems: Hypertension, tobacco use disorder, BPH,hyperlipidemia, hepatic steatosis, aortic atherosclerosis, and seasonal allergies among some.  Immunization History  Administered Date(s) Administered  . Tdap 09/26/2014   -Hep C screening: 05/12/17 NR Last colon cancer screening: 06/2014. 3 years follow up was recommended. Last prostate ca screening: PSA 4.4 in 04/2017. Hx of abnormal PSA. He remembers seeing urologist once, not sure when. Nocturia x 0.  Lab Results  Component Value Date   PSA 4.44 (H) 05/12/2017   Negative for high alcohol intake. + Smoker. He quit smoking for 5 years a few years ago and resumed in 2014.  -Concerns and/or follow up today:  Hyperlipidemia: Currently he is on nonpharmacologic treatment.  Lab Results  Component Value Date   CHOL 141 06/03/2018   HDL 77.60 06/03/2018   LDLCALC 53 06/03/2018   TRIG 52.0 06/03/2018   CHOLHDL 2 06/03/2018   Hypertension on lisinopril-HCTZ 20-25 mg daily.  For the past few days he has had tearing eyes, eye pruritus, and conjunctival erythema bilaterally. Negative for visual changes or eye pain. He is not using OTC medications.  Review of Systems  Constitutional: Negative for activity change, appetite change, fatigue, fever and unexpected weight change.  HENT: Negative for mouth sores, nosebleeds and sore throat.   Eyes: Positive for discharge, redness and itching. Negative for visual disturbance.  Respiratory: Negative for cough, shortness of breath and wheezing.   Cardiovascular: Negative for chest pain,  palpitations and leg swelling.  Gastrointestinal: Negative for abdominal pain, blood in stool, nausea and vomiting.  Endocrine: Negative for cold intolerance, heat intolerance, polydipsia, polyphagia and polyuria.  Genitourinary: Negative for decreased urine volume, dysuria, genital sores, hematuria and testicular pain.  Musculoskeletal: Negative for gait problem and myalgias.  Skin: Negative for color change and rash.  Allergic/Immunologic: Positive for environmental allergies.  Neurological: Negative for syncope, weakness and headaches.  Hematological: Negative for adenopathy. Does not bruise/bleed easily.  Psychiatric/Behavioral: Negative for confusion. The patient is not nervous/anxious.   All other systems reviewed and are negative.  No current outpatient medications on file prior to visit.   No current facility-administered medications on file prior to visit.   Past Medical History:  Diagnosis Date  . Hypertension   . Tobacco abuse 05/26/2014   Past Surgical History:  Procedure Laterality Date  . HAND SURGERY    . HERNIA REPAIR     No Known Allergies  Family History  Problem Relation Age of Onset  . Other Mother        deceased  . Bone cancer Paternal Uncle     Social History   Socioeconomic History  . Marital status: Married    Spouse name: Not on file  . Number of children: Not on file  . Years of education: Not on file  . Highest education level: Not on file  Occupational History  . Not on file  Tobacco Use  . Smoking status: Current Every Day Smoker    Packs/day: 0.50    Types: Cigarettes  . Smokeless tobacco: Never Used  Substance and  Sexual Activity  . Alcohol use: Yes    Alcohol/week: 0.0 standard drinks    Comment: daily intake of 2-3 liquor drinks and 3 beers daily  . Drug use: No  . Sexual activity: Not on file  Other Topics Concern  . Not on file  Social History Narrative   Work or School: [employer], Advertising copywriter Situation:  lives with wife whom sees [place]      Spiritual Beliefs: believes in God      Lifestyle: not now; diet is good      Social Determinants of Radio broadcast assistant Strain: Not on file  Food Insecurity: Not on file  Transportation Needs: Not on file  Physical Activity: Not on file  Stress: Not on file  Social Connections: Not on file    Vitals:   06/11/20 0931  BP: 130/80  Pulse: 94  Resp: 16  Temp: 98.4 F (36.9 C)  SpO2: 95%   Body mass index is 27.59 kg/m.  Wt Readings from Last 3 Encounters:  06/11/20 192 lb 4 oz (87.2 kg)  07/18/19 181 lb 8 oz (82.3 kg)  05/12/17 204 lb 4.8 oz (92.7 kg)   Physical Exam Vitals and nursing note reviewed.  Constitutional:      General: He is not in acute distress.    Appearance: He is well-developed.  HENT:     Head: Normocephalic and atraumatic.     Right Ear: Tympanic membrane, ear canal and external ear normal.     Left Ear: Tympanic membrane, ear canal and external ear normal.     Mouth/Throat:     Mouth: Mucous membranes are moist.     Pharynx: Oropharynx is clear.  Eyes:     General: Lids are normal.        Right eye: No foreign body or discharge.        Left eye: No foreign body or discharge.     Extraocular Movements: Extraocular movements intact.     Conjunctiva/sclera:     Right eye: Right conjunctiva is injected. No exudate or hemorrhage.    Left eye: Left conjunctiva is injected. No exudate or hemorrhage.    Pupils: Pupils are equal, round, and reactive to light.  Neck:     Thyroid: No thyromegaly.     Trachea: No tracheal deviation.  Cardiovascular:     Rate and Rhythm: Normal rate and regular rhythm.     Pulses:          Dorsalis pedis pulses are 2+ on the right side and 2+ on the left side.     Heart sounds: No murmur heard.   Pulmonary:     Effort: Pulmonary effort is normal. No respiratory distress.     Breath sounds: Normal breath sounds.  Chest:  Breasts:     Right: No supraclavicular  adenopathy.     Left: No supraclavicular adenopathy.    Abdominal:     Palpations: Abdomen is soft. There is no hepatomegaly or mass.     Tenderness: There is no abdominal tenderness.  Genitourinary:    Comments: No concerns. Musculoskeletal:        General: No tenderness.     Cervical back: Normal range of motion.     Comments: No major deformities appreciated and no signs of synovitis.  Lymphadenopathy:     Cervical: No cervical adenopathy.     Upper Body:     Right upper body: No supraclavicular adenopathy.  Left upper body: No supraclavicular adenopathy.  Skin:    General: Skin is warm.     Findings: No erythema.  Neurological:     General: No focal deficit present.     Mental Status: He is alert and oriented to person, place, and time.     Cranial Nerves: No cranial nerve deficit.     Sensory: No sensory deficit.     Coordination: Coordination normal.     Gait: Gait normal.     Deep Tendon Reflexes:     Reflex Scores:      Bicep reflexes are 2+ on the right side and 2+ on the left side.      Patellar reflexes are 2+ on the right side and 2+ on the left side. Psychiatric:        Mood and Affect: Mood is not anxious or depressed.    ASSESSMENT AND PLAN:  BrianDuard was seen today for annual exam.  Diagnoses and all orders for this visit: Orders Placed This Encounter  Procedures  . Comprehensive metabolic panel  . Hemoglobin A1c  . Lipid panel  . PSA(Must document that pt has been informed of limitations of PSA testing.)  . Ambulatory referral to Gastroenterology   Lab Results  Component Value Date   HGBA1C 5.7 06/11/2020   Lab Results  Component Value Date   CHOL 121 06/11/2020   HDL 72.70 06/11/2020   LDLCALC 40 06/11/2020   TRIG 42.0 06/11/2020   CHOLHDL 2 06/11/2020   Lab Results  Component Value Date   CREATININE 0.73 06/11/2020   BUN 7 06/11/2020   NA 141 06/11/2020   K 3.9 06/11/2020   CL 102 06/11/2020   CO2 32 06/11/2020   Lab Results   Component Value Date   ALT 29 06/11/2020   AST 39 (H) 06/11/2020   ALKPHOS 37 (L) 06/11/2020   BILITOT 0.6 06/11/2020   Routine general medical examination at a health care facility We discussed the importance of regular physical activity and healthy diet for prevention of chronic illness and/or complications. Preventive guidelines reviewed. Vaccination up-to-date. Encouraged smoking cessation, adverse effects discussed. Biometric form will be completed.  Next CPE in a year. The ASCVD Risk score Mikey Bussing DC Jr., et al., 2013) failed to calculate for the following reasons:   The valid total cholesterol range is 130 to 320 mg/dL  Primary hypertension BP adequately controlled. Recommend monitoring BP at home. Continue lisinopril-HCTZ 20-25 mg daily. Follow-up in 6 months, before if needed.  Allergic conjunctivitis of both eyes Recommend topical antihistaminic treatment. He has an appointment with his eye care provider, which he was instructed to keep. Instructed about warning signs.  -     Olopatadine HCl (PAZEO) 0.7 % SOLN; Apply 1 drop to eye daily.  Prostate cancer screening -     PSA(Must document that pt has been informed of limitations of PSA testing.)  Colon cancer screening -     Ambulatory referral to Gastroenterology  Screening for lipoid disorders -     Lipid panel  Screening for endocrine, metabolic and immunity disorder -     Hemoglobin A1c -     Comprehensive metabolic panel  Return in 6 months (on 12/11/2020) for HTN.   Sharin Altidor G. Martin[place], MD  [facility]. [place] office.  A few things to remember from today's visit:  Routine general medical examination at a health care facility  Primary hypertension  Allergic conjunctivitis of both eyes - Plan: Olopatadine HCl (PAZEO) 0.7 %  SOLN  Prostate cancer screening - Plan: PSA(Must document that pt has been informed of limitations of PSA testing.)  Colon cancer screening - Plan: Ambulatory  referral to Gastroenterology  Screening for lipoid disorders - Plan: Lipid panel  Screening for endocrine, metabolic and immunity disorder - Plan: Comprehensive metabolic panel, Hemoglobin A1c  Essential hypertension - Plan: lisinopril-hydrochlorothiazide (ZESTORETIC) 20-25 MG tablet  Smoking cessation encouraged.  If you need refills please call your pharmacy. Do not use My Chart to request refills or for acute issues that need immediate attention.   Please be sure medication list is accurate. If a new problem present, please set up appointment sooner than planned today.   At least 150 minutes of moderate exercise per week, daily brisk walking for 15-30 min is a good exercise option. Healthy diet low in saturated (animal) fats and sweets and consisting of fresh fruits and vegetables, lean meats such as fish and white chicken and whole grains.  - Vaccines:  Tdap vaccine every 10 years.  Shingles vaccine recommended at age 71, could be given after 58 years of age but not sure about insurance coverage.  Pneumonia vaccines: Pneumovax at 69  -Screening recommendations for low/normal risk males:  Screening for diabetes at age 78 and every 3 years. Earlier screening if cardiovascular risk factors.   Lipid screening at 35 and every 3 years. Screening starts in younger males with cardiovascular risk factors.  Colon cancer screening is now at age 58 but your insurance may not cover until age 97 .screening is recommended age 65.  Prostate cancer screening: some controversy, starts usually at 34: Rectal exam and PSA.  Aortic Abdominal Aneurism once between 31 and 70 years old if ever smoker.  Also recommended:  1. Dental visit- Brush and floss your teeth twice daily; visit your dentist twice a year. 2. Eye doctor- Get an eye exam at least every 2 years. 3. Helmet use- Always wear a helmet when riding a bicycle, motorcycle, rollerblading or skateboarding. 4. Safe sex- If you may be  exposed to sexually transmitted infections, use a condom. 5. Seat belts- Seat belts can save your live; always wear one. 6. Smoke/Carbon Monoxide detectors- These detectors need to be installed on the appropriate level of your home. Replace batteries at least once a year. 7. Skin cancer- When out in the sun please cover up and use sunscreen 15 SPF or higher. 8. Violence- If anyone is threatening or hurting you, please tell your healthcare provider.  9. Drink alcohol in moderation- Limit alcohol intake to one drink or less per day. Never drink and drive.

## 2020-06-11 NOTE — Patient Instructions (Addendum)
A few things to remember from today's visit:  Routine general medical examination at a health care facility  Primary hypertension  Allergic conjunctivitis of both eyes - Plan: Olopatadine HCl (PAZEO) 0.7 % SOLN  Prostate cancer screening - Plan: PSA(Must document that pt has been informed of limitations of PSA testing.)  Colon cancer screening - Plan: Ambulatory referral to Gastroenterology  Screening for lipoid disorders - Plan: Lipid panel  Screening for endocrine, metabolic and immunity disorder - Plan: Comprehensive metabolic panel, Hemoglobin A1c  Essential hypertension - Plan: lisinopril-hydrochlorothiazide (ZESTORETIC) 20-25 MG tablet  Smoking cessation encouraged.  If you need refills please call your pharmacy. Do not use My Chart to request refills or for acute issues that need immediate attention.   Please be sure medication list is accurate. If a new problem present, please set up appointment sooner than planned today.   At least 150 minutes of moderate exercise per week, daily brisk walking for 15-30 min is a good exercise option. Healthy diet low in saturated (animal) fats and sweets and consisting of fresh fruits and vegetables, lean meats such as fish and white chicken and whole grains.  - Vaccines:  Tdap vaccine every 10 years.  Shingles vaccine recommended at age 73, could be given after 58 years of age but not sure about insurance coverage.  Pneumonia vaccines: Pneumovax at 41  -Screening recommendations for low/normal risk males:  Screening for diabetes at age 14 and every 3 years. Earlier screening if cardiovascular risk factors.   Lipid screening at 35 and every 3 years. Screening starts in younger males with cardiovascular risk factors.  Colon cancer screening is now at age 43 but your insurance may not cover until age 60 .screening is recommended age 50.  Prostate cancer screening: some controversy, starts usually at 34: Rectal exam and  PSA.  Aortic Abdominal Aneurism once between 30 and 45 years old if ever smoker.  Also recommended:  1. Dental visit- Brush and floss your teeth twice daily; visit your dentist twice a year. 2. Eye doctor- Get an eye exam at least every 2 years. 3. Helmet use- Always wear a helmet when riding a bicycle, motorcycle, rollerblading or skateboarding. 4. Safe sex- If you may be exposed to sexually transmitted infections, use a condom. 5. Seat belts- Seat belts can save your live; always wear one. 6. Smoke/Carbon Monoxide detectors- These detectors need to be installed on the appropriate level of your home. Replace batteries at least once a year. 7. Skin cancer- When out in the sun please cover up and use sunscreen 15 SPF or higher. 8. Violence- If anyone is threatening or hurting you, please tell your healthcare provider.  9. Drink alcohol in moderation- Limit alcohol intake to one drink or less per day. Never drink and drive.

## 2020-08-14 DIAGNOSIS — H35722 Serous detachment of retinal pigment epithelium, left eye: Secondary | ICD-10-CM | POA: Diagnosis not present

## 2020-08-19 ENCOUNTER — Other Ambulatory Visit: Payer: Self-pay | Admitting: Family Medicine

## 2020-08-19 DIAGNOSIS — I10 Essential (primary) hypertension: Secondary | ICD-10-CM

## 2020-09-21 ENCOUNTER — Other Ambulatory Visit: Payer: Self-pay | Admitting: Surgery

## 2020-11-22 DIAGNOSIS — K4090 Unilateral inguinal hernia, without obstruction or gangrene, not specified as recurrent: Secondary | ICD-10-CM | POA: Diagnosis not present

## 2020-11-22 DIAGNOSIS — G8918 Other acute postprocedural pain: Secondary | ICD-10-CM | POA: Diagnosis not present

## 2021-03-04 ENCOUNTER — Telehealth: Payer: Self-pay | Admitting: Family Medicine

## 2021-03-04 DIAGNOSIS — I10 Essential (primary) hypertension: Secondary | ICD-10-CM

## 2021-03-04 MED ORDER — LISINOPRIL-HYDROCHLOROTHIAZIDE 20-25 MG PO TABS: 1.0000 | ORAL_TABLET | Freq: Every day | ORAL | 1 refills | Status: DC

## 2021-03-04 NOTE — Telephone Encounter (Signed)
Rx sent in

## 2021-03-04 NOTE — Telephone Encounter (Signed)
Patient's wife called to get refills on lisinopril-hydrochlorothiazide (ZESTORETIC) 20-25 MG tablet       Please send to [facility] at [address], [place], [place] [address]       Please advise

## 2021-08-14 DIAGNOSIS — Z1211 Encounter for screening for malignant neoplasm of colon: Secondary | ICD-10-CM | POA: Diagnosis not present

## 2021-08-14 DIAGNOSIS — I10 Essential (primary) hypertension: Secondary | ICD-10-CM | POA: Diagnosis not present

## 2021-09-12 DIAGNOSIS — D123 Benign neoplasm of transverse colon: Secondary | ICD-10-CM | POA: Diagnosis not present

## 2021-09-12 DIAGNOSIS — K621 Rectal polyp: Secondary | ICD-10-CM | POA: Diagnosis not present

## 2021-09-12 DIAGNOSIS — K635 Polyp of colon: Secondary | ICD-10-CM | POA: Diagnosis not present

## 2021-09-12 DIAGNOSIS — D122 Benign neoplasm of ascending colon: Secondary | ICD-10-CM | POA: Diagnosis not present

## 2021-09-12 DIAGNOSIS — Z86010 Personal history of colonic polyps: Secondary | ICD-10-CM | POA: Diagnosis not present

## 2021-09-12 DIAGNOSIS — Z1211 Encounter for screening for malignant neoplasm of colon: Secondary | ICD-10-CM | POA: Diagnosis not present

## 2021-09-24 DIAGNOSIS — H35722 Serous detachment of retinal pigment epithelium, left eye: Secondary | ICD-10-CM | POA: Diagnosis not present

## 2021-09-24 DIAGNOSIS — H16223 Keratoconjunctivitis sicca, not specified as Sjogren's, bilateral: Secondary | ICD-10-CM | POA: Diagnosis not present

## 2021-10-08 ENCOUNTER — Other Ambulatory Visit: Payer: Self-pay | Admitting: Family Medicine

## 2021-10-08 DIAGNOSIS — I10 Essential (primary) hypertension: Secondary | ICD-10-CM

## 2021-10-12 ENCOUNTER — Other Ambulatory Visit: Payer: Self-pay | Admitting: Family Medicine

## 2021-10-12 DIAGNOSIS — I10 Essential (primary) hypertension: Secondary | ICD-10-CM

## 2021-10-14 ENCOUNTER — Telehealth: Payer: Self-pay | Admitting: Family Medicine

## 2021-10-14 MED ORDER — LISINOPRIL-HYDROCHLOROTHIAZIDE 20-25 MG PO TABS: 1.0000 | ORAL_TABLET | Freq: Every day | ORAL | 1 refills | Status: DC

## 2021-10-14 NOTE — Addendum Note (Signed)
Addended by: Rodrigo Ran on: 10/14/2021 09:34 AM   Modules accepted: Orders

## 2021-10-14 NOTE — Telephone Encounter (Signed)
Pt says refill request was denied. Says they called in using an older bottle and the rx may be expired and they will try again. lisinopril-hydrochlorothiazide (ZESTORETIC) 20-25 MG tablet

## 2021-10-23 ENCOUNTER — Ambulatory Visit (INDEPENDENT_AMBULATORY_CARE_PROVIDER_SITE_OTHER): Payer: BC Managed Care – PPO | Admitting: Family Medicine

## 2021-10-23 ENCOUNTER — Encounter: Payer: Self-pay | Admitting: Family Medicine

## 2021-10-23 VITALS — BP 130/70 | HR 81 | Temp 98.6°F | Ht 70.0 in | Wt 180.1 lb

## 2021-10-23 DIAGNOSIS — L853 Xerosis cutis: Secondary | ICD-10-CM | POA: Diagnosis not present

## 2021-10-23 DIAGNOSIS — R1900 Intra-abdominal and pelvic swelling, mass and lump, unspecified site: Secondary | ICD-10-CM

## 2021-10-23 MED ORDER — DESOXIMETASONE 0.25 % EX OINT: 1.0000 | TOPICAL_OINTMENT | Freq: Two times a day (BID) | CUTANEOUS | 2 refills | Status: DC

## 2021-10-23 NOTE — Progress Notes (Signed)
Established Patient Office Visit  Subjective   Patient ID: Brian Barrett, male    DOB: 02/08/1961  Age: 61 y.o. MRN: [id number]  Chief Complaint  Patient presents with   Mass    Patient complains of mass in abdomen, x1 days    HPI   [name] seen for the following issues  Yesterday noted palpated mass just inferior to the umbilicus region.  He states about a year ago he had right inguinal hernia repair.  He just noticed this area yesterday with palpation incidentally.  No pain.  No nausea or vomiting.  No recent stool changes.  Just had recent colonoscopy 7/20.  Denies any recent appetite or weight changes.  No exacerbating factors regarding his area of swelling below the umbilicus  Dry hands and some pruritus.  He apparently has to wear latex gloves at work with some type of other outer occlusive glove as well.  Hands frequently perspire.  Has used over-the-counter hydrocortisone cream without relief.  Also has used moisturizers intermittently.  Past Medical History:  Diagnosis Date   Hypertension    Tobacco abuse 05/26/2014   Past Surgical History:  Procedure Laterality Date   HAND SURGERY     HERNIA REPAIR      reports that he has been smoking cigarettes. He has been smoking an average of .5 packs per day. He has never used smokeless tobacco. He reports current alcohol use. He reports that he does not use drugs. family history includes Bone cancer in his paternal uncle; Other in his mother. No Known Allergies  Review of Systems  Constitutional:  Negative for chills and fever.  Respiratory:  Negative for cough and shortness of breath.   Cardiovascular:  Negative for chest pain.  Gastrointestinal:  Negative for abdominal pain, blood in stool, constipation, diarrhea, nausea and vomiting.  Genitourinary:  Negative for dysuria.  Skin:  Positive for rash.      Objective:     BP 130/70 (BP Location: Left Arm, Patient Position: Sitting, Cuff Size: Normal)   Pulse 81    Temp 98.6 F (37 C) (Oral)   Ht '5\' 10"'$  (1.778 m)   Wt 180 lb 1.6 oz (81.7 kg)   SpO2 98%   BMI 25.84 kg/m    Physical Exam Vitals reviewed.  Constitutional:      Appearance: Normal appearance.  Cardiovascular:     Rate and Rhythm: Normal rate and regular rhythm.  Pulmonary:     Effort: Pulmonary effort is normal.     Breath sounds: Normal breath sounds.  Abdominal:     Comments: Nondistended.  He has palpated fullness/mass below the umbilicus region.  This is not reducible.  Nontender.  Cannot appreciate any umbilical hernia.  Skin:    Comments: He has some splotchy areas of slight hypopigmentation forearms bilaterally.  These are nonscaly.  Hands are very dry and cracked in several locations especially around the thumb region.  No palmar rash.  No pustules.  No vesicles.  Neurological:     Mental Status: He is alert.      No results found for any visits on 10/23/21.    The ASCVD Risk score (Arnett DK, et al., 2019) failed to calculate for the following reasons:   The valid total cholesterol range is 130 to 320 mg/dL    Assessment & Plan:   #1 abdominal mass inferior to the umbilicus region.  By palpation does not feel like a hernia.  Difficult to get a firm estimate of  size.  Recent colonoscopy in July.  No red flags such as appetite change or weight change.  -Obtain ultrasound to further assess  #2 dry skin dermatitis especially involving the hands.  Likely related to occupation.  Frequent glove use. -Avoid latex gloves as much as possible -Topicort 0.25% gel to use once or twice daily as needed for severe dryness and rash (no > 2 weeks continuously) and continue moisturizing lotions as much as possible.  Avoid prolonged use of potent steroid   No follow-ups on file.    Carolann Littler, MD

## 2021-10-30 ENCOUNTER — Ambulatory Visit
Admission: RE | Admit: 2021-10-30 | Discharge: 2021-10-30 | Disposition: A | Discharge status: Home / Self Care | Payer: BC Managed Care – PPO | Source: Ambulatory Visit | Attending: Family Medicine | Admitting: Family Medicine

## 2021-10-30 DIAGNOSIS — R1900 Intra-abdominal and pelvic swelling, mass and lump, unspecified site: Secondary | ICD-10-CM

## 2021-11-04 ENCOUNTER — Telehealth: Payer: Self-pay | Admitting: Family Medicine

## 2021-11-04 NOTE — Telephone Encounter (Signed)
Per imaging note, next step would be a CT. Okay to order or do you want to see him first? He saw Dr. Elease Hashimoto.

## 2021-11-04 NOTE — Telephone Encounter (Signed)
Pt had ultrasound for a mass in abdomen, says it was not picked up on ultrasound, would like to know the next step in the process for finding out what the mass is

## 2021-11-05 ENCOUNTER — Telehealth: Payer: Self-pay | Admitting: Family Medicine

## 2021-11-05 DIAGNOSIS — R1900 Intra-abdominal and pelvic swelling, mass and lump, unspecified site: Secondary | ICD-10-CM

## 2021-11-05 NOTE — Telephone Encounter (Signed)
Hi Dr. Elease Hashimoto,  You saw this patient on 8/30, are you okay with signing off on the CT scan? You did an ultrasound for him.

## 2021-11-05 NOTE — Telephone Encounter (Signed)
Message sent to Dr. Elease Hashimoto to see if he is okay ordering CT since he saw patient.

## 2021-11-05 NOTE — Telephone Encounter (Signed)
Pt wife call and stated he need a ct scan and want you to call her back at [phone]

## 2021-11-06 ENCOUNTER — Telehealth: Payer: Self-pay | Admitting: Family Medicine

## 2021-11-06 DIAGNOSIS — R1900 Intra-abdominal and pelvic swelling, mass and lump, unspecified site: Secondary | ICD-10-CM

## 2021-11-06 NOTE — Telephone Encounter (Signed)
I left patient a voicemail letting him know that CT order has been placed & he will receive a phone call to schedule the scan.

## 2021-11-06 NOTE — Telephone Encounter (Signed)
Pt's spouse called to say Pt is allergic to contrast and they have changed their minds about having the CT scan and would rather have an MRI.  Please advise.   Ms. Joanne Chars [phone]

## 2021-11-06 NOTE — Telephone Encounter (Signed)
See other telephone note.  

## 2021-11-08 NOTE — Telephone Encounter (Signed)
Patient's wife states the patient prefers MRI be ordered.

## 2021-11-11 NOTE — Telephone Encounter (Signed)
Patient's wife informed of the message and expressed understanding

## 2021-11-11 NOTE — Telephone Encounter (Signed)
Pt wife is calling and would like to know the reason pt can not have MRI

## 2021-11-12 NOTE — Telephone Encounter (Signed)
Left a message for the patient's wife to return my call.  

## 2021-11-12 NOTE — Addendum Note (Signed)
Addended by: Elza Rafter D on: 11/12/2021 04:15 PM   Modules accepted: Orders

## 2021-11-12 NOTE — Telephone Encounter (Signed)
Order placed as per MD recommendations of oral contrast only.  Attempted to call [facility] to confirm correct order placed (as appt already scheduled for 11/14/21). It's after hrs. Will send to CMA to call during office hours.

## 2021-11-12 NOTE — Telephone Encounter (Signed)
I received a call from the pt. Wife Brian Barrett in regards to patients CT scan. Mrs Brian Barrett wanted to make the provider aware that they would get the CT scan but her concern was that the patient is allergic to contrast. She stated about 7years ago the patient was seen at [facility] urology for a procedure and was given contrast and he begin to experience swelling. She stated it was during that time he was told he was allergic to contrast. She has called and was told that the patient could be given oral contrast but the order had to be changed to reflect that it could be given orally. Also she wanted to know if a medication could be prescribed like benadryl? Please call wife back at [phone] to discuss next steps. Thank you

## 2021-11-12 NOTE — Telephone Encounter (Signed)
Patient's wife informed of the message and expressed understanding. Wife stated that new order is needed per radiologist at imaging center that states oral contrast is to be used.

## 2021-11-13 NOTE — Telephone Encounter (Signed)
I spoke witch Cindy at [facility] and she stated the order placed by Garnette Czech is correct.

## 2021-11-14 ENCOUNTER — Ambulatory Visit (HOSPITAL_BASED_OUTPATIENT_CLINIC_OR_DEPARTMENT_OTHER)
Admission: RE | Admit: 2021-11-14 | Discharge: 2021-11-14 | Disposition: A | Discharge status: Home / Self Care | Payer: BC Managed Care – PPO | Source: Ambulatory Visit | Attending: Family Medicine | Admitting: Family Medicine

## 2021-11-14 ENCOUNTER — Encounter (HOSPITAL_BASED_OUTPATIENT_CLINIC_OR_DEPARTMENT_OTHER): Payer: Self-pay

## 2021-11-14 DIAGNOSIS — R1900 Intra-abdominal and pelvic swelling, mass and lump, unspecified site: Secondary | ICD-10-CM | POA: Insufficient documentation

## 2021-11-20 ENCOUNTER — Telehealth: Payer: Self-pay | Admitting: Family Medicine

## 2021-11-20 NOTE — Telephone Encounter (Signed)
Pt wife freida is calling and would like her husband ct abd/pelvis scan results

## 2021-11-20 NOTE — Telephone Encounter (Signed)
See result note.  

## 2021-12-09 NOTE — Progress Notes (Unsigned)
HPI: [name] is a 61 y.o.male with history of hypertension, aortic atherosclerosis, and tobacco use disorder here today for his routine physical examination.  Last CPE: 06/11/20  He has a smoking history of five years today, on and off, averaging 10 cigarettes per day.  He does not exercise regularly, but maintains an active job.  His diet consists of daily vegetables, chicken, fish, and salads.   Immunization History  Administered Date(s) Administered   Tdap 09/26/2014   Zoster Recombinat (Shingrix) 12/10/2021   Health Maintenance  Topic Date Due   COVID-19 Vaccine (1) 12/26/2021 (Originally 03/25/1961)   INFLUENZA VACCINE  05/25/2022 (Originally 09/24/2021)   Zoster Vaccines- Shingrix (2 of 2) 02/04/2022   TETANUS/TDAP  09/25/2024   COLONOSCOPY (Pts 45-80yr Insurance coverage will need to be confirmed)  09/13/2026   Hepatitis C Screening  Completed   HIV Screening  Addressed   HPV VACCINES  Aged Out   Last prostate ca screening:  Lab Results  Component Value Date   PSA 2.37 06/11/2020   PSA 4.44 (H) 05/12/2017  Negative for nocturia or changes in urinary frequency.  -Concerns and/or follow up today:   Hypertension on lisinopril-HCTZ 20-25 mg daily. He sleeps an average of six hours per night.  He occasionally checks their blood pressure at home and avoids extra salt intake. He visits his eye doctor every two years and is currently using eye drops for dry eye syndrome.  No chest pain, difficulty breathing, palpitations, or changes in bowel habits are reported.   Lab Results  Component Value Date   CREATININE 0.73 06/11/2020   BUN 7 06/11/2020   NA 141 06/11/2020   K 3.9 06/11/2020   CL 102 06/11/2020   CO2 32 06/11/2020   Lab Results  Component Value Date   CHOL 121 06/11/2020   HDL 72.70 06/11/2020   LDLCALC 40 06/11/2020   TRIG 42.0 06/11/2020   CHOLHDL 2 06/11/2020   Lab Results  Component Value Date   HGBA1C 5.7 06/11/2020   He reports  having abdominal surgery for an inguinal hernia since his last visit, 08/2020, he recovered well.  He had an abdominal/pelvic CT 11/16/2021 because palpable abdominal mass. It revealed small bilateral inguinal hernias, left greater than right, and a tiny fat-containing umbilical hernia.   Review of Systems  Constitutional:  Negative for activity change, appetite change and fever.  HENT:  Negative for mouth sores, nosebleeds, sore throat and trouble swallowing.   Eyes:  Negative for redness and visual disturbance.  Respiratory:  Positive for cough ("occasional"). Negative for shortness of breath and wheezing.   Cardiovascular:  Negative for chest pain, palpitations and leg swelling.  Gastrointestinal:  Negative for abdominal pain, blood in stool, nausea and vomiting.       No changes in bowel habits.  Endocrine: Negative for cold intolerance, heat intolerance, polydipsia, polyphagia and polyuria.  Genitourinary:  Negative for decreased urine volume, dysuria, genital sores, hematuria and testicular pain.  Musculoskeletal:  Negative for gait problem and myalgias.  Skin:  Negative for color change and rash.  Allergic/Immunologic: Negative for environmental allergies.  Neurological:  Negative for syncope, weakness and headaches.  Hematological:  Negative for adenopathy. Does not bruise/bleed easily.  Psychiatric/Behavioral:  Negative for confusion. The patient is not nervous/anxious.   All other systems reviewed and are negative.  Current Outpatient Medications on File Prior to Visit  Medication Sig Dispense Refill   Desoximetasone (TOPICORT) 0.25 % ointment Apply 1 Application topically 2 (two) times  daily. 30 g 2   lisinopril-hydrochlorothiazide (ZESTORETIC) 20-25 MG tablet Take 1 tablet by mouth daily. 90 tablet 1   Olopatadine HCl (PAZEO) 0.7 % SOLN Apply 1 drop to eye daily. 2.5 mL 0   No current facility-administered medications on file prior to visit.   Past Medical History:  Diagnosis  Date   Hypertension    Tobacco abuse 05/26/2014   Past Surgical History:  Procedure Laterality Date   HAND SURGERY     HERNIA REPAIR     Allergies  Allergen Reactions   Ivp Dye [Iodinated Contrast Media] Swelling    Family History  Problem Relation Age of Onset   Other Mother        deceased   Bone cancer Paternal Uncle     Social History   Socioeconomic History   Marital status: Married    Spouse name: Not on file   Number of children: Not on file   Years of education: Not on file   Highest education level: Not on file  Occupational History   Not on file  Tobacco Use   Smoking status: Every Day    Packs/day: 0.50    Years: 5.00    Total pack years: 2.50    Types: Cigarettes   Smokeless tobacco: Never  Substance and Sexual Activity   Alcohol use: Yes    Alcohol/week: 0.0 standard drinks of alcohol    Comment: daily intake of 2-3 liquor drinks and 3 beers daily   Drug use: No   Sexual activity: Not on file  Other Topics Concern   Not on file  Social History Narrative   Work or School: [employer], Advertising copywriter Situation: lives with wife whom sees [place]      Spiritual Beliefs: believes in God      Lifestyle: not now; diet is good      Social Determinants of Radio broadcast assistant Strain: Not on file  Food Insecurity: Not on file  Transportation Needs: Not on file  Physical Activity: Not on file  Stress: Not on file  Social Connections: Not on file   Vitals:   12/10/21 0659  BP: 128/80  Pulse: 78  Resp: 16  Temp: 98.7 F (37.1 C)  SpO2: 99%  Body mass index is 26.87 kg/m. Wt Readings from Last 3 Encounters:  12/10/21 187 lb 4 oz (84.9 kg)  10/23/21 180 lb 1.6 oz (81.7 kg)  06/11/20 192 lb 4 oz (87.2 kg)  Physical Exam Vitals and nursing note reviewed.  Constitutional:      General: He is not in acute distress.    Appearance: He is well-developed.  HENT:     Head: Normocephalic and atraumatic.     Right Ear: Tympanic  membrane, ear canal and external ear normal.     Left Ear: Tympanic membrane, ear canal and external ear normal.     Mouth/Throat:     Mouth: Mucous membranes are moist.     Pharynx: Oropharynx is clear.  Eyes:     Extraocular Movements: Extraocular movements intact.     Conjunctiva/sclera:     Right eye: Right conjunctiva is injected. No exudate or hemorrhage.    Left eye: Left conjunctiva is injected. No exudate or hemorrhage.    Pupils: Pupils are equal, round, and reactive to light.  Neck:     Thyroid: No thyromegaly.     Trachea: No tracheal deviation.  Cardiovascular:     Rate and Rhythm:  Normal rate. Rhythm irregular.     Pulses:          Dorsalis pedis pulses are 2+ on the right side and 2+ on the left side.     Heart sounds: No murmur heard. Pulmonary:     Effort: Pulmonary effort is normal. No respiratory distress.     Breath sounds: Normal breath sounds.  Abdominal:     Palpations: Abdomen is soft. There is no hepatomegaly or mass.     Tenderness: There is no abdominal tenderness.     Hernia: A hernia is present. Hernia is present in the umbilical area.  Genitourinary:    Comments: No concerns. Musculoskeletal:        General: No tenderness.     Cervical back: Normal range of motion.     Comments: No major deformities appreciated and no signs of synovitis.  Lymphadenopathy:     Cervical: No cervical adenopathy.     Upper Body:     Right upper body: No supraclavicular adenopathy.     Left upper body: No supraclavicular adenopathy.  Skin:    General: Skin is warm.     Findings: No erythema.  Neurological:     General: No focal deficit present.     Mental Status: He is alert and oriented to person, place, and time.     Cranial Nerves: No cranial nerve deficit.     Sensory: No sensory deficit.     Gait: Gait normal.     Deep Tendon Reflexes:     Reflex Scores:      Bicep reflexes are 2+ on the right side and 2+ on the left side.      Patellar reflexes are 2+ on  the right side and 2+ on the left side. Psychiatric:        Mood and Affect: Mood and affect normal.   ASSESSMENT AND PLAN:  Mr.Jayleen was seen today for annual exam and follow-up.  Diagnoses and all orders for this visit: Orders Placed This Encounter  Procedures   Varicella-zoster vaccine IM   Comprehensive metabolic panel   Lipid panel   PSA(Must document that pt has been informed of limitations of PSA testing.)   TSH   Hemoglobin A1c   CBC   Ambulatory referral to General Surgery   Ambulatory referral to Cardiology   EKG 12-Lead   Lab Results  Component Value Date   CREATININE 0.71 12/10/2021   BUN 7 12/10/2021   NA 139 12/10/2021   K 3.9 12/10/2021   CL 96 12/10/2021   CO2 33 (H) 12/10/2021   Lab Results  Component Value Date   WBC 4.3 12/10/2021   HGB 14.2 12/10/2021   HCT 41.1 12/10/2021   MCV 100.9 (H) 12/10/2021   PLT 163.0 12/10/2021   Lab Results  Component Value Date   ALT 63 (H) 12/10/2021   AST 66 (H) 12/10/2021   ALKPHOS 40 12/10/2021   BILITOT 0.9 12/10/2021   Lab Results  Component Value Date   CHOL 139 12/10/2021   HDL 83.10 12/10/2021   LDLCALC 48 12/10/2021   TRIG 41.0 12/10/2021   CHOLHDL 2 12/10/2021   Lab Results  Component Value Date   HGBA1C 5.7 12/10/2021   Lab Results  Component Value Date   PSA 2.54 12/10/2021             Lab Results  Component Value Date   TSH 0.70 12/10/2021   Routine general medical examination at a health care facility  We discussed the importance of regular physical activity and healthy diet for prevention of chronic illness and/or complications. Preventive guidelines reviewed. Vaccination: Shingrix given today, declined influenza vaccine and pneumovax. Next CPE in a year.  Screening for lipoid disorders -     Lipid panel; Future  Screening for endocrine, metabolic and immunity disorder -     Comprehensive metabolic panel; Future -     Hemoglobin A1c; Future  Prostate cancer screening -      PSA(Must document that pt has been informed of limitations of PSA testing.); Future  Bilateral inguinal hernia without obstruction or gangrene, recurrence not specified And small umbilical hernia. Currently he is asymptomatic. Instructed about warning signs. Appointment with general surgeon will be arranged, he would like to see Dr. Ninfa Linden again.  Irregular heart rhythm Noted on examination today, asymptomatic. EKG today: NSR, normal axis and intervals, occasional PVC's, which are new compared with EKG done in 2016. Cardiology referral placed. Instructed about warning signs.  Need for shingles vaccine -     Varicella-zoster vaccine IM  Hypertension BP adequately controlled. Continue current management: Lisinopril-HCTZ same dose. DASH/low salt diet to continue. Monitor BP at home. Eye exam up to date.  Aortic atherosclerosis ([place]) He is not on statin medication. We discussed CV benefits. Further recommendations according to FLP results.  Return in 6 months (on 06/11/2022).  Jubilee Vivero G. [place], MD  [facility]. [place] office.

## 2021-12-10 ENCOUNTER — Ambulatory Visit (INDEPENDENT_AMBULATORY_CARE_PROVIDER_SITE_OTHER): Payer: BC Managed Care – PPO | Admitting: Family Medicine

## 2021-12-10 ENCOUNTER — Encounter: Payer: Self-pay | Admitting: Family Medicine

## 2021-12-10 VITALS — BP 128/80 | HR 78 | Temp 98.7°F | Resp 16 | Ht 70.0 in | Wt 187.2 lb

## 2021-12-10 DIAGNOSIS — I10 Essential (primary) hypertension: Secondary | ICD-10-CM | POA: Diagnosis not present

## 2021-12-10 DIAGNOSIS — Z0000 Encounter for general adult medical examination without abnormal findings: Secondary | ICD-10-CM | POA: Diagnosis not present

## 2021-12-10 DIAGNOSIS — I499 Cardiac arrhythmia, unspecified: Secondary | ICD-10-CM

## 2021-12-10 DIAGNOSIS — I700 Atherosclerosis of aorta: Secondary | ICD-10-CM

## 2021-12-10 DIAGNOSIS — Z13220 Encounter for screening for lipoid disorders: Secondary | ICD-10-CM | POA: Diagnosis not present

## 2021-12-10 DIAGNOSIS — Z1329 Encounter for screening for other suspected endocrine disorder: Secondary | ICD-10-CM

## 2021-12-10 DIAGNOSIS — K4020 Bilateral inguinal hernia, without obstruction or gangrene, not specified as recurrent: Secondary | ICD-10-CM | POA: Diagnosis not present

## 2021-12-10 DIAGNOSIS — Z23 Encounter for immunization: Secondary | ICD-10-CM | POA: Diagnosis not present

## 2021-12-10 DIAGNOSIS — Z13228 Encounter for screening for other metabolic disorders: Secondary | ICD-10-CM

## 2021-12-10 DIAGNOSIS — Z130 Encounter for screening for diseases of the blood and blood-forming organs and certain disorders involving the immune mechanism: Secondary | ICD-10-CM | POA: Diagnosis not present

## 2021-12-10 DIAGNOSIS — Z125 Encounter for screening for malignant neoplasm of prostate: Secondary | ICD-10-CM

## 2021-12-10 LAB — CBC
HCT: 41.1 % (ref 39.0–52.0)
Hemoglobin: 14.2 g/dL (ref 13.0–17.0)
MCHC: 34.6 g/dL (ref 30.0–36.0)
MCV: 100.9 fl — ABNORMAL HIGH (ref 78.0–100.0)
Platelets: 163 10*3/uL (ref 150.0–400.0)
RBC: 4.07 Mil/uL — ABNORMAL LOW (ref 4.22–5.81)
RDW: 13.6 % (ref 11.5–15.5)
WBC: 4.3 10*3/uL (ref 4.0–10.5)

## 2021-12-10 LAB — COMPREHENSIVE METABOLIC PANEL
ALT: 63 U/L — ABNORMAL HIGH (ref 0–53)
AST: 66 U/L — ABNORMAL HIGH (ref 0–37)
Albumin: 4.4 g/dL (ref 3.5–5.2)
Alkaline Phosphatase: 40 U/L (ref 39–117)
BUN: 7 mg/dL (ref 6–23)
CO2: 33 mEq/L — ABNORMAL HIGH (ref 19–32)
Calcium: 9.3 mg/dL (ref 8.4–10.5)
Chloride: 96 mEq/L (ref 96–112)
Creatinine, Ser: 0.71 mg/dL (ref 0.40–1.50)
GFR: 100.63 mL/min (ref >60.00)
Glucose, Bld: 81 mg/dL (ref 70–99)
Potassium: 3.9 mEq/L (ref 3.5–5.1)
Sodium: 139 mEq/L (ref 135–145)
Total Bilirubin: 0.9 mg/dL (ref 0.2–1.2)
Total Protein: 7.3 g/dL (ref 6.0–8.3)

## 2021-12-10 LAB — LIPID PANEL
Cholesterol: 139 mg/dL (ref 0–200)
HDL: 83.1 mg/dL (ref >39.00)
LDL Cholesterol: 48 mg/dL (ref 0–99)
NonHDL: 55.87
Total CHOL/HDL Ratio: 2
Triglycerides: 41 mg/dL (ref 0.0–149.0)
VLDL: 8.2 mg/dL (ref 0.0–40.0)

## 2021-12-10 LAB — TSH: TSH: 0.7 u[IU]/mL (ref 0.35–5.50)

## 2021-12-10 LAB — HEMOGLOBIN A1C: Hgb A1c MFr Bld: 5.7 % (ref 4.6–6.5)

## 2021-12-10 LAB — PSA: PSA: 2.54 ng/mL (ref 0.10–4.00)

## 2021-12-10 NOTE — Assessment & Plan Note (Signed)
He is not on statin medication. We discussed CV benefits. Further recommendations according to FLP results.

## 2021-12-10 NOTE — Assessment & Plan Note (Signed)
BP adequately controlled. Continue current management: Lisinopril-HCTZ same dose. DASH/low salt diet to continue. Monitor BP at home. Eye exam up to date.

## 2021-12-10 NOTE — Patient Instructions (Addendum)
A few things to remember from today's visit:  Routine general medical examination at a health care facility  Screening for lipoid disorders - Plan: Lipid panel  Screening for endocrine, metabolic and immunity disorder - Plan: Comprehensive metabolic panel, Hemoglobin A1c  Prostate cancer screening - Plan: PSA(Must document that pt has been informed of limitations of PSA testing.)  Primary hypertension - Plan: EKG 12-Lead, TSH, CBC  Aortic atherosclerosis ([place]), Chronic - Plan: Lipid panel  Bilateral inguinal hernia without obstruction or gangrene, recurrence not specified - Plan: Ambulatory referral to General Surgery  Irregular heart rhythm - Plan: EKG 12-Lead, TSH, CBC  1. Continue monitoring your blood pressure at home and avoid extra salt in your diet. 2. Schedule an appointment with Dr. Coralie Keens for evaluation of your left inguinal hernia. 3. If you experience persistent pain, nausea, or other concerning symptoms around your hernias, go to the Emergency Department immediately. 4. Avoid heavy lifting to prevent further complications with your hernias. 5. Maintain a healthy diet, including daily vegetables, chicken, fish, and salads. 6. Consider incorporating regular exercise into your routine, 150 min per week. 7. Continue using eye drops for dry eye syndrome and visit your eye doctor as recommended. 8. Get the shingles vaccine administered by the nurse today. You also need a pneumonia vaccine because history of smoking. 9. Today we performed an EKG to check your heart rhythm, irregular heart rate today, new when compared it to your last EKG from 2016. Appt with cardiologist is going to be arranged. 10. Smoking cessation is very important and encouraged.  If you need refills for medications you take chronically, please call your pharmacy. Do not use My Chart to request refills or for acute issues that need immediate attention. If you send a my chart message, it may take a  few days to be addressed, specially if I am not in the office.  Please be sure medication list is accurate. If a new problem present, please set up appointment sooner than planned today.  Health Maintenance, Male Adopting a healthy lifestyle and getting preventive care are important in promoting health and wellness. Ask your health care provider about: The right schedule for you to have regular tests and exams. Things you can do on your own to prevent diseases and keep yourself healthy. What should I know about diet, weight, and exercise? Eat a healthy diet  Eat a diet that includes plenty of vegetables, fruits, low-fat dairy products, and lean protein. Do not eat a lot of foods that are high in solid fats, added sugars, or sodium. Maintain a healthy weight Body mass index (BMI) is a measurement that can be used to identify possible weight problems. It estimates body fat based on height and weight. Your health care provider can help determine your BMI and help you achieve or maintain a healthy weight. Get regular exercise Get regular exercise. This is one of the most important things you can do for your health. Most adults should: Exercise for at least 150 minutes each week. The exercise should increase your heart rate and make you sweat (moderate-intensity exercise). Do strengthening exercises at least twice a week. This is in addition to the moderate-intensity exercise. Spend less time sitting. Even light physical activity can be beneficial. Watch cholesterol and blood lipids Have your blood tested for lipids and cholesterol at 59 years of age, then have this test every 5 years. You may need to have your cholesterol levels checked more often if: Your lipid or cholesterol  levels are high. You are older than 59 years of age. You are at high risk for heart disease. What should I know about cancer screening? Many types of cancers can be detected early and may often be prevented. Depending  on your health history and family history, you may need to have cancer screening at various ages. This may include screening for: Colorectal cancer. Prostate cancer. Skin cancer. Lung cancer. What should I know about heart disease, diabetes, and high blood pressure? Blood pressure and heart disease High blood pressure causes heart disease and increases the risk of stroke. This is more likely to develop in people who have high blood pressure readings or are overweight. Talk with your health care provider about your target blood pressure readings. Have your blood pressure checked: Every 3-5 years if you are 30-42 years of age. Every year if you are 80 years old or older. If you are between the ages of 94 and 49 and are a current or former smoker, ask your health care provider if you should have a one-time screening for abdominal aortic aneurysm (AAA). Diabetes Have regular diabetes screenings. This checks your fasting blood sugar level. Have the screening done: Once every three years after age 3 if you are at a normal weight and have a low risk for diabetes. More often and at a younger age if you are overweight or have a high risk for diabetes. What should I know about preventing infection? Hepatitis B If you have a higher risk for hepatitis B, you should be screened for this virus. Talk with your health care provider to find out if you are at risk for hepatitis B infection. Hepatitis C Blood testing is recommended for: Everyone born from [birth year] through 1963. Anyone with known risk factors for hepatitis C. Sexually transmitted infections (STIs) You should be screened each year for STIs, including gonorrhea and chlamydia, if: You are sexually active and are younger than 59 years of age. You are older than 59 years of age and your health care provider tells you that you are at risk for this type of infection. Your sexual activity has changed since you were last screened, and you are at  increased risk for chlamydia or gonorrhea. Ask your health care provider if you are at risk. Ask your health care provider about whether you are at high risk for HIV. Your health care provider may recommend a prescription medicine to help prevent HIV infection. If you choose to take medicine to prevent HIV, you should first get tested for HIV. You should then be tested every 3 months for as long as you are taking the medicine. Follow these instructions at home: Alcohol use Do not drink alcohol if your health care provider tells you not to drink. If you drink alcohol: Limit how much you have to 0-2 drinks a day. Know how much alcohol is in your drink. In the [place] one drink equals one 12 oz bottle of beer (355 mL), one 5 oz glass of wine (148 mL), or one 1 oz glass of hard liquor (44 mL). Lifestyle Do not use any products that contain nicotine or tobacco. These products include cigarettes, chewing tobacco, and vaping devices, such as e-cigarettes. If you need help quitting, ask your health care provider. Do not use street drugs. Do not share needles. Ask your health care provider for help if you need support or information about quitting drugs. General instructions Schedule regular health, dental, and eye exams. Stay current with your vaccines.  Tell your health care provider if: You often feel depressed. You have ever been abused or do not feel safe at home. Summary Adopting a healthy lifestyle and getting preventive care are important in promoting health and wellness. Follow your health care provider's instructions about healthy diet, exercising, and getting tested or screened for diseases. Follow your health care provider's instructions on monitoring your cholesterol and blood pressure. This information is not intended to replace advice given to you by your health care provider. Make sure you discuss any questions you have with your health care provider. Document Revised: 07/02/2020  Document Reviewed: 07/02/2020 Elsevier Patient Education  [place].

## 2021-12-11 ENCOUNTER — Ambulatory Visit: Payer: BC Managed Care – PPO | Attending: Internal Medicine | Admitting: Internal Medicine

## 2021-12-11 ENCOUNTER — Ambulatory Visit: Payer: BC Managed Care – PPO | Attending: Internal Medicine

## 2021-12-11 ENCOUNTER — Encounter: Payer: Self-pay | Admitting: Internal Medicine

## 2021-12-11 VITALS — BP 148/90 | HR 88 | Wt 179.4 lb

## 2021-12-11 DIAGNOSIS — I493 Ventricular premature depolarization: Secondary | ICD-10-CM | POA: Diagnosis not present

## 2021-12-11 DIAGNOSIS — F17200 Nicotine dependence, unspecified, uncomplicated: Secondary | ICD-10-CM | POA: Diagnosis not present

## 2021-12-11 DIAGNOSIS — I10 Essential (primary) hypertension: Secondary | ICD-10-CM | POA: Diagnosis not present

## 2021-12-11 DIAGNOSIS — Z01810 Encounter for preprocedural cardiovascular examination: Secondary | ICD-10-CM | POA: Diagnosis not present

## 2021-12-11 NOTE — Patient Instructions (Signed)
Medication Instructions:  NO CHANGES  *If you need a refill on your cardiac medications before your next appointment, please call your pharmacy*   Testing/Procedures: Your physician has requested that you have an echocardiogram. Echocardiography is a painless test that uses sound waves to create images of your heart. It provides your doctor with information about the size and shape of your heart and how well your heart's chambers and valves are working. This procedure takes approximately one hour. There are no restrictions for this procedure. Please do NOT wear cologne, perfume, aftershave, or lotions (deodorant is allowed). Please arrive 15 minutes prior to your appointment time.  ZIO XT- Long Term Monitor Instructions  Your physician has requested you wear a ZIO patch monitor for 14 days.  This is a single patch monitor. [employer] supplies one patch monitor per enrollment. Additional stickers are not available. Please do not apply patch if you will be having a Nuclear Stress Test,  Echocardiogram, Cardiac CT, MRI, or Chest Xray during the period you would be wearing the  monitor. The patch cannot be worn during these tests. You cannot remove and re-apply the  ZIO XT patch monitor.  Your ZIO patch monitor will be mailed 3 day USPS to your address on file. It may take 3-5 days  to receive your monitor after you have been enrolled.  Once you have received your monitor, please review the enclosed instructions. Your monitor  has already been registered assigning a specific monitor serial # to you.  Billing and Patient Assistance Program Information  We have supplied [employer] with any of your insurance information on file for billing purposes. [employer] offers a sliding scale Patient Assistance Program for patients that do not have  insurance, or whose insurance does not completely cover the cost of the ZIO monitor.  You must apply for the Patient Assistance Program to qualify for this discounted  rate.  To apply, please call [employer] at [phone], select option 4, select option 2, ask to apply for  Patient Assistance Program. Theodore Demark will ask your household income, and how many people  are in your household. They will quote your out-of-pocket cost based on that information.  [employer] will also be able to set up a 40-month interest-free payment plan if needed.  Applying the monitor   Shave hair from upper left chest.  Hold abrader disc by orange tab. Rub abrader in 40 strokes over the upper left chest as  indicated in your monitor instructions.  Clean area with 4 enclosed alcohol pads. Let dry.  Apply patch as indicated in monitor instructions. Patch will be placed under collarbone on left  side of chest with arrow pointing upward.  Rub patch adhesive wings for 2 minutes. Remove white label marked "1". Remove the white  label marked "2". Rub patch adhesive wings for 2 additional minutes.  While looking in a mirror, press and release button in center of patch. A small green light will  flash 3-4 times. This will be your only indicator that the monitor has been turned on.  Do not shower for the first 24 hours. You may shower after the first 24 hours.  Press the button if you feel a symptom. You will hear a small click. Record Date, Time and  Symptom in the Patient Logbook.  When you are ready to remove the patch, follow instructions on the last 2 pages of Patient  Logbook. Stick patch monitor onto the last page of Patient Logbook.  Place Patient Logbook in the  blue and white box. Use locking tab on box and tape box closed  securely. The blue and white box has prepaid postage on it. Please place it in the mailbox as  soon as possible. Your physician should have your test results approximately 7 days after the  monitor has been mailed back to [facility].  Call [place] at [phone] if you have questions regarding  your ZIO XT patch monitor. Call them  immediately if you see an orange light blinking on your  monitor.  If your monitor falls off in less than 4 days, contact our Monitor department at [phone].  If your monitor becomes loose or falls off after 4 days call [employer] at [phone] for  suggestions on securing your monitor    Follow-Up: At [facility], you and your health needs are our priority.  As part of our continuing mission to provide you with exceptional heart care, we have created designated Provider Care Teams.  These Care Teams include your primary Cardiologist (physician) and Advanced Practice Providers (APPs -  Physician Assistants and Nurse Practitioners) who all work together to provide you with the care you need, when you need it.  We recommend signing up for the patient portal called "MyChart".  Sign up information is provided on this After Visit Summary.  MyChart is used to connect with patients for Virtual Visits (Telemedicine).  Patients are able to view lab/test results, encounter notes, upcoming appointments, etc.  Non-urgent messages can be sent to your provider as well.   To learn more about what you can do with MyChart, go to [email or web address].    Your next appointment:    4-6 weeks with Dr. Debara Pickett

## 2021-12-11 NOTE — Progress Notes (Signed)
OFFICE NOTE  Chief Complaint:  Irregular heart rhythm, preoperative evaluation  Primary Care Physician: Martinique, Betty G, MD  HPI:  Brian Barrett is a pleasant 61 year old male who was referred to me from the emergency department for follow-up of chest pain. He presented with an achy chest pain that was worse with taking deep inspirations. It was present for several days. It seemed to respond to pain medicine and ultimately went away. He's had no recurrence or chest pain or shortness of breath with exertion. He stays fairly active, walking at work and plays golf. He denies any history of heart disease. There is no family history of heart disease. He does have hypertension which is been treated for about 5 years but may been present longer. Blood pressure is elevated today however he reports he takes his blood pressure medicine the afternoons. EKG in the hospital showed elevated voltages and J-point elevation concerning for I repeated an EKG today and it looks unchanged. Hospital workup failed to reveal any ischemia and troponins were negative.  12/11/2021  Brian Barrett is seen today for irregular heart rhythm.  I had seen him last in 2016 for atypical chest pain, therefore he is considered a new patient.  Follow-up was as needed.  He had no cardiac testing at that time.  He does have a history of hypertension and was noted to have some voltage criteria for LVH on his EKG.  Recently his PCP noted that he had an irregular heartbeat and EKG showed PVCs.  He was referred for further evaluation of this in addition he was noted to have a hernia and is desiring an upcoming surgical repair.  Brian Barrett says he is unaware of his PVCs.  He denies any chest pain or worsening shortness of breath.  EKG today does show multiple PVCs as well as high voltages concerning for LVH.  Blood pressure was a little elevated today however he says at home is pretty well controlled in the 937T to 024O  systolic.  PMHx:  Past Medical History:  Diagnosis Date   Hypertension    Tobacco abuse 05/26/2014    Past Surgical History:  Procedure Laterality Date   HAND SURGERY     HERNIA REPAIR      FAMHx:  Family History  Problem Relation Age of Onset   Other Mother        deceased   Bone cancer Paternal Uncle     Mother died of a blood transfusion reaction  SOCHx:   reports that he has been smoking cigarettes. He has a 2.50 pack-year smoking history. He has never used smokeless tobacco. He reports current alcohol use. He reports that he does not use drugs.  ALLERGIES:  Allergies  Allergen Reactions   Ivp Dye [Iodinated Contrast Media] Swelling    ROS: A comprehensive review of systems was negative.  HOME MEDS: Current Outpatient Medications  Medication Sig Dispense Refill   Desoximetasone (TOPICORT) 0.25 % ointment Apply 1 Application topically 2 (two) times daily. 30 g 2   lisinopril-hydrochlorothiazide (ZESTORETIC) 20-25 MG tablet Take 1 tablet by mouth daily. 90 tablet 1   Olopatadine HCl (PAZEO) 0.7 % SOLN Apply 1 drop to eye daily. 2.5 mL 0   No current facility-administered medications for this visit.    LABS/IMAGING: Results for orders placed or performed in visit on 12/10/21 (from the past 48 hour(s))  CBC     Status: Abnormal   Collection Time: 12/10/21  7:38 AM  Result Value  Ref Range   WBC 4.3 4.0 - 10.5 K/uL   RBC 4.07 (L) 4.22 - 5.81 Mil/uL   Platelets 163.0 150.0 - 400.0 K/uL   Hemoglobin 14.2 13.0 - 17.0 g/dL   HCT 41.1 39.0 - 52.0 %   MCV 100.9 (H) 78.0 - 100.0 fl   MCHC 34.6 30.0 - 36.0 g/dL   RDW 13.6 11.5 - 15.5 %  Hemoglobin A1c     Status: None   Collection Time: 12/10/21  7:38 AM  Result Value Ref Range   Hgb A1c MFr Bld 5.7 4.6 - 6.5 %    Comment: Glycemic Control Guidelines for People with Diabetes:Non Diabetic:  <6%Goal of Therapy: <7%Additional Action Suggested:  >8%   TSH     Status: None   Collection Time: 12/10/21  7:38 AM  Result  Value Ref Range   TSH 0.70 0.35 - 5.50 uIU/mL  PSA(Must document that pt has been informed of limitations of PSA testing.)     Status: None   Collection Time: 12/10/21  7:38 AM  Result Value Ref Range   PSA 2.54 0.10 - 4.00 ng/mL    Comment: Test performed using Access Hybritech PSA Assay, a parmagnetic partical, chemiluminecent immunoassay.  Lipid panel     Status: None   Collection Time: 12/10/21  7:38 AM  Result Value Ref Range   Cholesterol 139 0 - 200 mg/dL    Comment: ATP III Classification       Desirable:  < 200 mg/dL               Borderline High:  200 - 239 mg/dL          High:  > = 240 mg/dL   Triglycerides 41.0 0.0 - 149.0 mg/dL    Comment: Normal:  <150 mg/dLBorderline High:  150 - 199 mg/dL   HDL 83.10 >39.00 mg/dL   VLDL 8.2 0.0 - 40.0 mg/dL   LDL Cholesterol 48 0 - 99 mg/dL   Total CHOL/HDL Ratio 2     Comment:                Men          Women1/2 Average Risk     3.4          3.3Average Risk          5.0          4.42X Average Risk          9.6          7.13X Average Risk          15.0          11.0                       NonHDL 55.87     Comment: NOTE:  Non-HDL goal should be 30 mg/dL higher than patient's LDL goal (i.e. LDL goal of < 70 mg/dL, would have non-HDL goal of < 100 mg/dL)  Comprehensive metabolic panel     Status: Abnormal   Collection Time: 12/10/21  7:38 AM  Result Value Ref Range   Sodium 139 135 - 145 mEq/L   Potassium 3.9 3.5 - 5.1 mEq/L   Chloride 96 96 - 112 mEq/L   CO2 33 (H) 19 - 32 mEq/L   Glucose, Bld 81 70 - 99 mg/dL   BUN 7 6 - 23 mg/dL   Creatinine, Ser 0.71 0.40 - 1.50 mg/dL   Total Bilirubin  0.9 0.2 - 1.2 mg/dL   Alkaline Phosphatase 40 39 - 117 U/L   AST 66 (H) 0 - 37 U/L   ALT 63 (H) 0 - 53 U/L   Total Protein 7.3 6.0 - 8.3 g/dL   Albumin 4.4 3.5 - 5.2 g/dL   GFR 100.63 >60.00 mL/min    Comment: Calculated using the CKD-EPI Creatinine Equation (2021)   Calcium 9.3 8.4 - 10.5 mg/dL   No results found.  WEIGHTS: Wt Readings from  Last 3 Encounters:  12/11/21 179 lb 6.4 oz (81.4 kg)  12/10/21 187 lb 4 oz (84.9 kg)  10/23/21 180 lb 1.6 oz (81.7 kg)    VITALS: BP (!) 148/90 (BP Location: Left Arm, Patient Position: Sitting)   Pulse 88   Wt 179 lb 6.4 oz (81.4 kg)   SpO2 97%   BMI 25.74 kg/m   EXAM: General appearance: alert and no distress Neck: no carotid bruit, no JVD and thyroid not enlarged, symmetric, no tenderness/mass/nodules Lungs: clear to auscultation bilaterally Heart: regular rate and rhythm, S1, S2 normal, no murmur, click, rub or gallop Abdomen: soft, non-tender; bowel sounds normal; no masses,  no organomegaly Extremities: extremities normal, atraumatic, no cyanosis or edema Pulses: 2+ and symmetric Skin: Skin color, texture, turgor normal. No rashes or lesions Neurologic: Grossly normal Psych: Normal  EKG: Sinus rhythm with sinus arrhythmia and frequent PVCs, moderate voltage criteria for LVH at 88-personally reviewed  ASSESSMENT: Frequent PVCs -asymptomatic Indeterminate risk for upcoming hernia repair Pleuritic chest pain-resolved Hypertension Voltage criteria for LVH with repolarization Tobacco abuse  PLAN: 1.   Mr. Carneiro is having frequent PVCs with a right bundle morphology.  This suggests left-sided etiology.  He is asymptomatic with this.  He does have significant voltage criteria for LVH however has had reasonably well-controlled hypertension, which makes me suspect he could have a cardiomyopathy such as hypertrophic cardiomyopathy.  I would like to get an echocardiogram to look for structural changes that might explain his PVCs.  He has denied any angina or worsening shortness of breath with exertion.  He may ultimately need an ischemia evaluation as well.  He continues to smoke which is obviously a cardiac risk factor.  I will also place a 2-week ZIO monitor to see his burden of PVCs.  Frequent PVCs could increase his risk of PVC related cardiomyopathy.  Plan follow-up with me  after.  Thanks again for the kind referral.  Pixie Casino, MD, FACC, [place] Director of the Advanced Lipid Disorders &  Cardiovascular Risk Reduction Clinic Diplomate of the American Board of Clinical Lipidology Attending Cardiologist  Direct Dial: [phone]  Fax: [phone]  Website:  [email or web address] 12/11/2021, 4:05 PM

## 2021-12-11 NOTE — Progress Notes (Unsigned)
Enrolled for [employer] to mail a ZIO XT long term holter monitor to the patients address on file.   Dr. Hilty to read. 

## 2021-12-13 ENCOUNTER — Telehealth: Payer: Self-pay | Admitting: Family Medicine

## 2021-12-13 DIAGNOSIS — K760 Fatty (change of) liver, not elsewhere classified: Secondary | ICD-10-CM

## 2021-12-13 NOTE — Telephone Encounter (Signed)
I spoke with patient's wife. She is concerned about possible cirrhosis of the liver. Patient drinks about a pint of liquor daily and drinks beer every day. She did mention that he has had a fatty liver for a long time and wanted to be sure nothing else was going on. Is there a way we can test for this?

## 2021-12-13 NOTE — Telephone Encounter (Signed)
Pt's spouse called to say she saw on Pt's Mychart some things regarding his labs that were very concerning and she is wondering why no one has called them?  Spouse would like a call back as soon as possible.

## 2021-12-13 NOTE — Telephone Encounter (Signed)
Recent abdominal CT did not show signs of cirrhosis or liver lesions but rather fatty liver. He needs to progressively decrease and stop alcohol intake. If he is concerned about abnormal liver function test, GI consultation can be arranged. Thanks, BJ

## 2021-12-15 DIAGNOSIS — I493 Ventricular premature depolarization: Secondary | ICD-10-CM

## 2021-12-16 ENCOUNTER — Telehealth: Payer: Self-pay | Admitting: Internal Medicine

## 2021-12-16 NOTE — Telephone Encounter (Signed)
Spoke to patient's wife echo appointment rescheduled to 11/7 at 3:00 pm arrive at 2:45 pm.

## 2021-12-16 NOTE — Telephone Encounter (Signed)
Patient's wife would like to know if long term monitor will interfere with echo on 11/02. Please advise.

## 2021-12-16 NOTE — Telephone Encounter (Signed)
I spoke with patient's wife. She is aware of message below & referral to GI has been placed.

## 2021-12-26 ENCOUNTER — Other Ambulatory Visit (HOSPITAL_COMMUNITY): Payer: BC Managed Care – PPO

## 2021-12-31 ENCOUNTER — Other Ambulatory Visit (HOSPITAL_COMMUNITY): Payer: BC Managed Care – PPO

## 2021-12-31 ENCOUNTER — Ambulatory Visit (HOSPITAL_COMMUNITY): Payer: BC Managed Care – PPO | Attending: Internal Medicine

## 2021-12-31 DIAGNOSIS — I493 Ventricular premature depolarization: Secondary | ICD-10-CM | POA: Insufficient documentation

## 2021-12-31 LAB — ECHOCARDIOGRAM COMPLETE
Area-P 1/2: 4.49 cm2
S' Lateral: 4.5 cm

## 2022-01-01 DIAGNOSIS — I493 Ventricular premature depolarization: Secondary | ICD-10-CM | POA: Diagnosis not present

## 2022-01-06 ENCOUNTER — Telehealth: Payer: Self-pay | Admitting: *Deleted

## 2022-01-06 NOTE — Telephone Encounter (Signed)
Left message on wife voicemail to call back for results

## 2022-01-06 NOTE — Telephone Encounter (Signed)
-----   Message from Pixie Casino, MD sent at 01/03/2022 12:16 PM EST ----- Monitor shows frequent PVC's and short runs of NSVT - not symptomatic. LVEF was normal on echo.  Please refer to EP to see if they recommend suppression of PVC's or monitoring.  Dr. Lemmie Evens

## 2022-01-07 ENCOUNTER — Encounter: Payer: Self-pay | Admitting: Internal Medicine

## 2022-01-07 ENCOUNTER — Ambulatory Visit: Payer: BC Managed Care – PPO | Attending: Internal Medicine | Admitting: Internal Medicine

## 2022-01-07 VITALS — BP 152/75 | HR 69 | Ht 70.0 in | Wt 184.2 lb

## 2022-01-07 DIAGNOSIS — R079 Chest pain, unspecified: Secondary | ICD-10-CM | POA: Diagnosis not present

## 2022-01-07 DIAGNOSIS — I4729 Other ventricular tachycardia: Secondary | ICD-10-CM

## 2022-01-07 DIAGNOSIS — I493 Ventricular premature depolarization: Secondary | ICD-10-CM

## 2022-01-07 DIAGNOSIS — I10 Essential (primary) hypertension: Secondary | ICD-10-CM

## 2022-01-07 DIAGNOSIS — F17200 Nicotine dependence, unspecified, uncomplicated: Secondary | ICD-10-CM | POA: Diagnosis not present

## 2022-01-07 NOTE — Progress Notes (Signed)
OFFICE NOTE  Chief Complaint:  Follow-up monitor  Primary Care Physician: Martin[place], Betty G, MD  HPI:  Brian Barrett is a pleasant 61 year old male who was referred to me from the emergency department for follow-up of chest pain. He presented with an achy chest pain that was worse with taking deep inspirations. It was present for several days. It seemed to respond to pain medicine and ultimately went away. He's had no recurrence or chest pain or shortness of breath with exertion. He stays fairly active, walking at work and plays golf. He denies any history of heart disease. There is no family history of heart disease. He does have hypertension which is been treated for about 5 years but may been present longer. Blood pressure is elevated today however he reports he takes his blood pressure medicine the afternoons. EKG in the hospital showed elevated voltages and J-point elevation concerning for I repeated an EKG today and it looks unchanged. Hospital workup failed to reveal any ischemia and troponins were negative.  12/11/2021  Brian Barrett is seen today for irregular heart rhythm.  I had seen him last in 2016 for atypical chest pain, therefore he is considered a new patient.  Follow-up was as needed.  He had no cardiac testing at that time.  He does have a history of hypertension and was noted to have some voltage criteria for LVH on his EKG.  Recently his PCP noted that he had an irregular heartbeat and EKG showed PVCs.  He was referred for further evaluation of this in addition he was noted to have a hernia and is desiring an upcoming surgical repair.  Brian Barrett says he is unaware of his PVCs.  He denies any chest pain or worsening shortness of breath.  EKG today does show multiple PVCs as well as high voltages concerning for LVH.  Blood pressure was a little elevated today however he says at home is pretty well controlled in the 629U to 765Y systolic.  65/04/5463  Brian Barrett seen  today for follow-up of his monitor.  He underwent a 2-week ZIO monitor which showed a burden of PVCs about 9%.  He also had several short runs of nonsustained VT.  Echocardiogram showed low normal EF of 53% with left atrial enlargement.  He has had no chest pain.  He is planning upcoming hernia surgery with Dr. Ninfa Linden.  He seems to be asymptomatic with the PVCs.  He chest pain in the past which seems somewhat atypical, however he has never had stress testing.  PMHx:  Past Medical History:  Diagnosis Date   Hypertension    Tobacco abuse 05/26/2014    Past Surgical History:  Procedure Laterality Date   HAND SURGERY     HERNIA REPAIR      FAMHx:  Family History  Problem Relation Age of Onset   Other Mother        deceased   Bone cancer Paternal Uncle     Mother died of a blood transfusion reaction  SOCHx:   reports that he has been smoking cigarettes. He has a 2.50 pack-year smoking history. He has never used smokeless tobacco. He reports current alcohol use. He reports that he does not use drugs.  ALLERGIES:  Allergies  Allergen Reactions   Ivp Dye [Iodinated Contrast Media] Swelling    ROS: A comprehensive review of systems was negative.  HOME MEDS: Current Outpatient Medications  Medication Sig Dispense Refill   Desoximetasone (TOPICORT) 0.25 % ointment Apply  1 Application topically 2 (two) times daily. 30 g 2   lisinopril-hydrochlorothiazide (ZESTORETIC) 20-25 MG tablet Take 1 tablet by mouth daily. 90 tablet 1   Olopatadine HCl (PAZEO) 0.7 % SOLN Apply 1 drop to eye daily. 2.5 mL 0   No current facility-administered medications for this visit.    LABS/IMAGING: No results found for this or any previous visit (from the past 48 hour(s)).  No results found.  WEIGHTS: Wt Readings from Last 3 Encounters:  01/07/22 184 lb 3.2 oz (83.6 kg)  12/11/21 179 lb 6.4 oz (81.4 kg)  12/10/21 187 lb 4 oz (84.9 kg)    VITALS: BP (!) 152/75 (BP Location: Left Arm, Patient  Position: Sitting)   Pulse 69   Ht '5\' 10"'$  (1.778 m)   Wt 184 lb 3.2 oz (83.6 kg)   SpO2 100%   BMI 26.43 kg/m   EXAM: Deferred  EKG: Deferred  ASSESSMENT: Frequent PVCs -burden 9.3% NSVT Indeterminate risk for upcoming hernia repair Pleuritic chest pain-resolved Hypertension Voltage criteria for LVH with repolarization Tobacco abuse  PLAN: 1.   Brian Barrett had a low normal EF at 53% on echo.  He had frequent PVCs with a burden of 9.3% and some nonsustained VT.  He does not seem to be having any ischemic symptoms although does get some chest discomfort which sounds atypical.  He is scheduled for an upcoming hernia surgery.  I would like to get a Myoview stress test to rule out any ischemia.  This would better restratify him for surgery.  If he is low risk I would intend to send him to cardiac EP for evaluation of antiarrhythmic therapy to suppress his PVCs which could be associated with development of cardiomyopathy given the frequency.  Plan ultimately follow-up with me in several months.   Pixie Casino, MD, [facility], [place] Director of the Advanced Lipid Disorders &  Cardiovascular Risk Reduction Clinic Diplomate of the American Board of Clinical Lipidology Attending Cardiologist  Direct Dial: [phone]  Fax: [phone]  Website:  [email or web address] 01/07/2022, 2:44 PM

## 2022-01-07 NOTE — Patient Instructions (Signed)
Medication Instructions:  NO CHANGES  *If you need a refill on your cardiac medications before your next appointment, please call your pharmacy*   Testing/Procedures: Dr. Debara Pickett has ordered a Myocardial Perfusion Imaging Study. This is done at [facility] [address] [employer].    The test will take approximately 3 to 4 hours to complete; you may bring reading material.  If someone comes with you to your appointment, they will need to remain in the main lobby due to limited space in the testing area. **If you are pregnant or breastfeeding, please notify the nuclear lab prior to your appointment**  You will need to hold the following medications prior to your stress test: beta-blockers (24 hours prior to test)   How to prepare for your Myocardial Perfusion Test: Do not eat or drink 3 hours prior to your test, except you may have water. Do not consume products containing caffeine (regular or decaffeinated) 12 hours prior to your test. (ex: coffee, chocolate, sodas, tea). Do wear comfortable clothes (no dresses or overalls) and walking shoes, tennis shoes preferred (No heels or open toe shoes are allowed). Do NOT wear cologne, perfume, aftershave, or lotions (deodorant is allowed). If these instructions are not followed, your test will have to be rescheduled.    Follow-Up: At [facility], you and your health needs are our priority.  As part of our continuing mission to provide you with exceptional heart care, we have created designated Provider Care Teams.  These Care Teams include your primary Cardiologist (physician) and Advanced Practice Providers (APPs -  Physician Assistants and Nurse Practitioners) who all work together to provide you with the care you need, when you need it.  We recommend signing up for the patient portal called "MyChart".  Sign up information is provided on this After Visit Summary.  MyChart is used to connect with patients for Virtual  Visits (Telemedicine).  Patients are able to view lab/test results, encounter notes, upcoming appointments, etc.  Non-urgent messages can be sent to your provider as well.   To learn more about what you can do with MyChart, go to [email or web address].    Your next appointment:    6 months with Dr. Debara Pickett

## 2022-01-08 ENCOUNTER — Telehealth (HOSPITAL_COMMUNITY): Payer: Self-pay | Admitting: *Deleted

## 2022-01-08 NOTE — Telephone Encounter (Signed)
Left message on voicemail per DPR in reference to upcoming appointment scheduled on 01/13/2022 at 10:30 with detailed instructions given per Myocardial Perfusion Study Information Sheet for the test. LM to arrive 15 minutes early, and that it is imperative to arrive on time for appointment to keep from having the test rescheduled. If you need to cancel or reschedule your appointment, please call the office within 24 hours of your appointment. Failure to do so may result in a cancellation of your appointment, and a $50 no show fee. Phone number given for call back for any questions.

## 2022-01-09 NOTE — Telephone Encounter (Signed)
Patient seen 01/07/22

## 2022-01-13 ENCOUNTER — Ambulatory Visit (HOSPITAL_COMMUNITY): Payer: BC Managed Care – PPO | Attending: Internal Medicine

## 2022-01-13 DIAGNOSIS — F17200 Nicotine dependence, unspecified, uncomplicated: Secondary | ICD-10-CM

## 2022-01-13 DIAGNOSIS — I10 Essential (primary) hypertension: Secondary | ICD-10-CM | POA: Diagnosis not present

## 2022-01-13 DIAGNOSIS — I493 Ventricular premature depolarization: Secondary | ICD-10-CM | POA: Diagnosis not present

## 2022-01-13 DIAGNOSIS — R079 Chest pain, unspecified: Secondary | ICD-10-CM

## 2022-01-13 DIAGNOSIS — I4729 Other ventricular tachycardia: Secondary | ICD-10-CM

## 2022-01-13 LAB — MYOCARDIAL PERFUSION IMAGING
Angina Index: 0
Estimated workload: 7
Exercise duration (min): 6 min
Exercise duration (sec): 0 s
LV dias vol: 148 mL (ref 62–150)
LV sys vol: 94 mL
MPHR: 161 {beats}/min
Nuc Stress EF: 37 %
Peak HR: 153 {beats}/min
Percent HR: 95 %
Rest HR: 63 {beats}/min
Rest Nuclear Isotope Dose: 10.1 mCi
SDS: 0
SRS: 0
SSS: 0
Stress Nuclear Isotope Dose: 31.5 mCi
TID: 0.91

## 2022-01-13 MED ORDER — TECHNETIUM TC 99M TETROFOSMIN IV KIT
10.1000 | PACK | Freq: Once | INTRAVENOUS | Status: AC | PRN
  Administered 2022-01-13: 10.1 via INTRAVENOUS

## 2022-01-13 MED ORDER — TECHNETIUM TC 99M TETROFOSMIN IV KIT
31.5000 | PACK | Freq: Once | INTRAVENOUS | Status: AC | PRN
  Administered 2022-01-13: 31.5 via INTRAVENOUS

## 2022-04-27 ENCOUNTER — Other Ambulatory Visit: Payer: Self-pay | Admitting: Family Medicine

## 2022-04-27 DIAGNOSIS — I10 Essential (primary) hypertension: Secondary | ICD-10-CM

## 2022-07-15 ENCOUNTER — Ambulatory Visit: Payer: BC Managed Care – PPO | Admitting: Internal Medicine

## 2022-09-11 ENCOUNTER — Ambulatory Visit (INDEPENDENT_AMBULATORY_CARE_PROVIDER_SITE_OTHER): Payer: BC Managed Care – PPO | Admitting: Family Medicine

## 2022-09-11 ENCOUNTER — Encounter: Payer: Self-pay | Admitting: Family Medicine

## 2022-09-11 ENCOUNTER — Other Ambulatory Visit: Payer: Self-pay | Admitting: *Deleted

## 2022-09-11 ENCOUNTER — Telehealth: Payer: Self-pay | Admitting: Family Medicine

## 2022-09-11 VITALS — BP 120/74 | HR 87 | Temp 98.4°F | Wt 173.8 lb

## 2022-09-11 DIAGNOSIS — I10 Essential (primary) hypertension: Secondary | ICD-10-CM

## 2022-09-11 DIAGNOSIS — R55 Syncope and collapse: Secondary | ICD-10-CM

## 2022-09-11 MED ORDER — LISINOPRIL 20 MG PO TABS: 20.0000 mg | ORAL_TABLET | Freq: Every day | ORAL | 2 refills | Status: DC

## 2022-09-11 NOTE — Telephone Encounter (Signed)
Patient requesting his return to work letter to state "without restrictions"  asks that it be loaded to [employer]. Says he needs it before work in the morning

## 2022-09-11 NOTE — Progress Notes (Signed)
Subjective:    Patient ID: Brian Barrett, male    DOB: 06/14/1960, 62 y.o.   MRN: [id number]  HPI Here with his wife for an episode of passing out at work earlier this morning. He has never done this before. He got to work around 5 AM as usual, and he drank a [employer] while he was working. He did not eat any breakfast as usual. Then at 7 AM he and some coworkers sat in chairs around a conference table for a meeting. Apparently he was observed to become unresponsive and to slump forward for a few seconds before he regained consciousness. He does not remember any of this. Just before this he remembers feeling hot, but not sweaty. He did not feel shaky or weak. No SOB or chest pain or palpitations. His coworkers said he did not clench or shake during this episode. Shiheem says he felt completely normal when it was over. His wife came to the site and drove him home, and she has been with him ever since. She says nothing unusual has happened. Of note, last November he was noted to have an irregular heartbeat during a routine exam, and an EKG showed frequent PVC's. He was referred to Dr. Rennis Golden for a cardiologic evaluation. He had a SPECT myoview perfusion study that was normal. He had an ECHO showing an EF of 53% with indeterminate diastolic parameters. He also wore a monitor that showed frequent PVC's and a few short runs of nonsustained VT (the longest being 8 beats). Dr. Rennis Golden felt he had no cardiologic problems.    Review of Systems  Constitutional: Negative.   Respiratory: Negative.    Cardiovascular: Negative.   Gastrointestinal: Negative.   Genitourinary: Negative.   Neurological:  Positive for syncope. Negative for dizziness, tremors, seizures, facial asymmetry, speech difficulty, weakness, light-headedness, numbness and headaches.       Objective:   Physical Exam Constitutional:      Appearance: Normal appearance. He is not ill-appearing.  Neck:     Vascular: No carotid bruit.   Cardiovascular:     Rate and Rhythm: Normal rate and regular rhythm.     Pulses: Normal pulses.     Heart sounds: Normal heart sounds.     Comments: He has occasional ectopic beats  Pulmonary:     Effort: Pulmonary effort is normal.     Breath sounds: Normal breath sounds.  Musculoskeletal:     Cervical back: Neck supple.     Right lower leg: No edema.     Left lower leg: No edema.  Lymphadenopathy:     Cervical: No cervical adenopathy.  Neurological:     General: No focal deficit present.     Mental Status: He is alert and oriented to person, place, and time.     Coordination: Coordination normal.     Gait: Gait normal.           Assessment & Plan:  He had what sounds like a very brief syncopal spell this morning. The most likely etiologies would be either a hypoglycemic spell or orthostasis. He was started on his current BP medication a year ago, and since then he has lost a significant amount of weight by adjusting his diet. His HTN may now be overtreated. We will stop the hydrochlorothiazide, and he will take Lisinopril 20 mg daily. We will set up a carotid doppler study. He will follow up with his PCP, Dr. [place].  We spent a total of (34   )  minutes reviewing records and discussing these issues.   Gershon Crane, MD

## 2022-09-12 NOTE — Telephone Encounter (Signed)
FYI Spoke with pt and wife, stated that she was very upset on how she was treated by the office on 09/11/22. Pt wife spouse stated that she requested for the return to work note to say that pt should return to work with no restrictions. Dr Clent Ridges had already given pt the note at his visit stating to return to work with no restrictions. Stated that she wanted to speak with the office manager in regards to how she was treated by the office, Apologized to pt/ spouse advised that office manager will be back next week and that I would send a message with the request.

## 2022-09-12 NOTE — Telephone Encounter (Signed)
The note I gave him already says "with no restrictions"

## 2022-09-26 ENCOUNTER — Ambulatory Visit (HOSPITAL_COMMUNITY): Admission: RE | Admit: 2022-09-26 | Payer: BC Managed Care – PPO | Source: Ambulatory Visit

## 2022-09-26 DIAGNOSIS — R55 Syncope and collapse: Secondary | ICD-10-CM

## 2024-01-20 ENCOUNTER — Emergency Department (HOSPITAL_COMMUNITY)

## 2024-01-20 ENCOUNTER — Other Ambulatory Visit: Payer: Self-pay

## 2024-01-20 ENCOUNTER — Emergency Department (HOSPITAL_COMMUNITY)
Admission: EM | Admit: 2024-01-20 | Discharge: 2024-01-20 | Disposition: A | Discharge status: Home / Self Care | Attending: Emergency Medicine | Admitting: Emergency Medicine

## 2024-01-20 DIAGNOSIS — X501XXA Overexertion from prolonged static or awkward postures, initial encounter: Secondary | ICD-10-CM | POA: Diagnosis not present

## 2024-01-20 DIAGNOSIS — M545 Low back pain, unspecified: Secondary | ICD-10-CM | POA: Diagnosis not present

## 2024-01-20 DIAGNOSIS — Z79899 Other long term (current) drug therapy: Secondary | ICD-10-CM | POA: Insufficient documentation

## 2024-01-20 DIAGNOSIS — M47817 Spondylosis without myelopathy or radiculopathy, lumbosacral region: Secondary | ICD-10-CM | POA: Diagnosis not present

## 2024-01-20 DIAGNOSIS — M5431 Sciatica, right side: Secondary | ICD-10-CM | POA: Diagnosis not present

## 2024-01-20 DIAGNOSIS — M47816 Spondylosis without myelopathy or radiculopathy, lumbar region: Secondary | ICD-10-CM | POA: Diagnosis not present

## 2024-01-20 DIAGNOSIS — M5441 Lumbago with sciatica, right side: Secondary | ICD-10-CM | POA: Diagnosis not present

## 2024-01-20 DIAGNOSIS — I10 Essential (primary) hypertension: Secondary | ICD-10-CM | POA: Diagnosis not present

## 2024-01-20 LAB — CBC WITH DIFFERENTIAL/PLATELET
Abs Immature Granulocytes: 0.01 K/uL (ref 0.00–0.07)
Basophils Absolute: 0 K/uL (ref 0.0–0.1)
Basophils Relative: 1 %
Eosinophils Absolute: 0.1 K/uL (ref 0.0–0.5)
Eosinophils Relative: 1 %
HCT: 45.3 % (ref 39.0–52.0)
Hemoglobin: 15.5 g/dL (ref 13.0–17.0)
Immature Granulocytes: 0 %
Lymphocytes Relative: 24 %
Lymphs Abs: 1.3 K/uL (ref 0.7–4.0)
MCH: 33.1 pg (ref 26.0–34.0)
MCHC: 34.2 g/dL (ref 30.0–36.0)
MCV: 96.8 fL (ref 80.0–100.0)
Monocytes Absolute: 0.5 K/uL (ref 0.1–1.0)
Monocytes Relative: 10 %
Neutro Abs: 3.5 K/uL (ref 1.7–7.7)
Neutrophils Relative %: 64 %
Platelets: 167 K/uL (ref 150–400)
RBC: 4.68 MIL/uL (ref 4.22–5.81)
RDW: 13.1 % (ref 11.5–15.5)
WBC: 5.4 K/uL (ref 4.0–10.5)
nRBC: 0 % (ref 0.0–0.2)

## 2024-01-20 LAB — COMPREHENSIVE METABOLIC PANEL WITH GFR
ALT: 24 U/L (ref 0–44)
AST: 35 U/L (ref 15–41)
Albumin: 4.2 g/dL (ref 3.5–5.0)
Alkaline Phosphatase: 49 U/L (ref 38–126)
Anion gap: 10 (ref 5–15)
BUN: 7 mg/dL — ABNORMAL LOW (ref 8–23)
CO2: 27 mmol/L (ref 22–32)
Calcium: 9.3 mg/dL (ref 8.9–10.3)
Chloride: 101 mmol/L (ref 98–111)
Creatinine, Ser: 0.69 mg/dL (ref 0.61–1.24)
GFR, Estimated: >60 mL/min (ref >60)
Glucose, Bld: 97 mg/dL (ref 70–99)
Potassium: 4.3 mmol/L (ref 3.5–5.1)
Sodium: 138 mmol/L (ref 135–145)
Total Bilirubin: 1.3 mg/dL — ABNORMAL HIGH (ref 0.0–1.2)
Total Protein: 7.6 g/dL (ref 6.5–8.1)

## 2024-01-20 LAB — URINALYSIS, ROUTINE W REFLEX MICROSCOPIC
Bilirubin Urine: NEGATIVE
Glucose, UA: NEGATIVE mg/dL
Hgb urine dipstick: NEGATIVE
Ketones, ur: 5 mg/dL — AB
Leukocytes,Ua: NEGATIVE
Nitrite: NEGATIVE
Protein, ur: NEGATIVE mg/dL
Specific Gravity, Urine: 1.019 (ref 1.005–1.030)
pH: 7 (ref 5.0–8.0)

## 2024-01-20 MED ORDER — PREDNISONE 20 MG PO TABS
60.0000 mg | ORAL_TABLET | Freq: Once | ORAL | Status: AC
  Administered 2024-01-20: 60 mg via ORAL
  Filled 2024-01-20: qty 3

## 2024-01-20 MED ORDER — HYDROCODONE-ACETAMINOPHEN 5-325 MG PO TABS: 1.0000 | ORAL_TABLET | Freq: Four times a day (QID) | ORAL | 0 refills | Status: DC | PRN

## 2024-01-20 MED ORDER — HYDROCODONE-ACETAMINOPHEN 5-325 MG PO TABS
1.0000 | ORAL_TABLET | Freq: Once | ORAL | Status: AC
  Administered 2024-01-20: 1 via ORAL
  Filled 2024-01-20: qty 1

## 2024-01-20 MED ORDER — PREDNISONE 10 MG (21) PO TBPK: ORAL_TABLET | Freq: Every day | ORAL | 0 refills | Status: DC

## 2024-01-20 NOTE — ED Provider Notes (Signed)
 [place] EMERGENCY DEPARTMENT AT [facility] Provider Note   CSN: [id number] Arrival date & time: 01/20/24  9073     Patient presents with: Back Pain and Leg Pain   Brian Barrett is a 63 y.o. male.   HPI Patient reports that he developed pain in his lower back on the right he indicates the lumbar area right over the iliac wing and close to the SI joint Monday evening.  He reports he is going to bed and started pretty acutely.  It was not triggered by specific movement or activity.  He reports he has pain that radiates down his leg.  An achy pain that goes all the way down to his foot.  Patient reports because the pain is making it harder to walk.  Patient denies has had any specific weakness.  He has not had loss of control bowel or bladder.  No abdominal pain.  No flank pain.  No blood in the urine.  Patient has no prior history of sciatica.  He did change jobs and is stepping up and down more frequently now on a step.  He has tried ibuprofen and Tylenol  at home without any relief.  Pain is getting worse.  Patient's medical history is positive for hypertension.  Patient reports he takes lisinopril  for hypertension.  No other medical problems.    Prior to Admission medications   Medication Sig Start Date End Date Taking? Authorizing Provider  HYDROcodone -acetaminophen  (NORCO/VICODIN) 5-325 MG tablet Take 1-2 tablets by mouth every 6 (six) hours as needed. 01/20/24  Yes Teah Votaw, Ludivina, MD  predniSONE  (STERAPRED UNI-PAK 21 TAB) 10 MG (21) TBPK tablet Take by mouth daily. Take 6 tabs by mouth daily  for 2 days, then 5 tabs for 2 days, then 4 tabs for 2 days, then 3 tabs for 2 days, 2 tabs for 2 days, then 1 tab by mouth daily for 2 days 01/20/24  Yes Machell Wirthlin, Ludivina, MD  Desoximetasone  (TOPICORT ) 0.25 % ointment Apply 1 Application topically 2 (two) times daily. 10/23/21   Burchette, Wolm ORN, MD  lisinopril  (ZESTRIL ) 20 MG tablet Take 1 tablet (20 mg total) by mouth daily. 09/11/22    Johnny Garnette LABOR, MD  Olopatadine HCl (PAZEO) 0.7 % SOLN Apply 1 drop to eye daily. 06/11/20   Jordan, Betty G, MD    Allergies: Ivp dye [iodinated contrast media]    Review of Systems  Updated Vital Signs BP (!) 173/100 (BP Location: Left Arm)   Pulse 92   Temp 98.3 F (36.8 C) (Oral)   Resp 16   SpO2 100%   Physical Exam Constitutional:      Comments: Alert nontoxic well in appearance.  HENT:     Mouth/Throat:     Pharynx: Oropharynx is clear.  Eyes:     Extraocular Movements: Extraocular movements intact.  Cardiovascular:     Rate and Rhythm: Normal rate and regular rhythm.  Pulmonary:     Effort: Pulmonary effort is normal.     Breath sounds: Normal breath sounds.  Abdominal:     General: There is no distension.     Palpations: Abdomen is soft.     Tenderness: There is no abdominal tenderness. There is no guarding.  Musculoskeletal:     Comments: No midline reproducible back pain.  Patient does have reproducible pain over the right SI joint area.  No soft tissue abnormalities of the lower extremities.  Legs are symmetric.  No calf pain.  No peripheral edema.  Range  of motion is intact.  Patient has some reproducible lumbar pain with straight leg raise on the right but does have intact good range of motion.  No pain with straight leg raise on the left.  Neurological:     General: No focal deficit present.     Mental Status: He is oriented to person, place, and time.     Motor: No weakness.     Coordination: Coordination normal.     Comments: Bilateral lower extremity strength intact.  No deficit  Psychiatric:        Mood and Affect: Mood normal.     (all labs ordered are listed, but only abnormal results are displayed) Labs Reviewed  COMPREHENSIVE METABOLIC PANEL WITH GFR - Abnormal; Notable for the following components:      Result Value   BUN 7 (*)    Total Bilirubin 1.3 (*)    All other components within normal limits  URINALYSIS, ROUTINE W REFLEX MICROSCOPIC  - Abnormal; Notable for the following components:   APPearance CLOUDY (*)    Ketones, ur 5 (*)    All other components within normal limits  CBC WITH DIFFERENTIAL/PLATELET    EKG: None  Radiology: DG Lumbar Spine Complete Result Date: 01/20/2024 EXAM: 4 Or More VIEW(S) XRAY OF THE LUMBAR SPINE 01/20/2024 11:21:00 AM COMPARISON: None available. CLINICAL HISTORY: Sciatica FINDINGS: LUMBAR SPINE: BONES: No acute fracture. No aggressive appearing osseous lesion. Alignment is normal. DISCS AND DEGENERATIVE CHANGES: Transitional lumbosacral vertebra noted at L5-S1. Mild degenerative disc disease at L5-S1. SOFT TISSUES: No acute abnormality. JOINTS: Mild lower lumbar facet degenerative joint disease at L4-L5 and L5-S1. IMPRESSION: 1. Mild degenerative disc disease at L5-S1. 2. Mild lower lumbar facet degenerative joint disease at L4-L5 and L5-S1. 3. Transitional lumbosacral vertebra at S1. Electronically signed by: Norleen Kil MD 01/20/2024 12:16 PM EST RP Workstation: [id number]     Procedures   Medications Ordered in the ED  predniSONE  (DELTASONE ) tablet 60 mg (60 mg Oral Given 01/20/24 1124)  HYDROcodone -acetaminophen  (NORCO/VICODIN) 5-325 MG per tablet 1 tablet (1 tablet Oral Given 01/20/24 1124)                                    Medical Decision Making Amount and/or Complexity of Data Reviewed Labs: ordered. Radiology: ordered.  Risk Prescription drug management.   Patient presents as outlined.  Findings are consistent with sciatica.  He has no abdominal pain, no blood in the urine, no flank pain.  At this time low suspicion for kidney stone or intra-abdominal process.  Patient does not have numbness weakness or dysfunction.  Will obtain baseline lumbar x-rays.  Patient's wife wishes patient to have basic lab work and urinalysis as well.  Patient does have history of hypertension.  Can check basic labs and urine.  I do not see any on file for about 2 years.  Plain film x-rays  inter by radiology show degenerative disease but no acute emergent findings.  Urinalysis negative.  Metabolic panel and CBC normal.  Findings consistent with sciatica.  Will prescribe prednisone  taper and Vicodin for more acute pain phase.  Patient is advised to taper or discontinue Vicodin as soon as possible and start regular Exer strength Tylenol .  Follow-up plan with PCP and home care included in discharge instructions.     Final diagnoses:  Sciatica of right side    ED Discharge Orders  Ordered    HYDROcodone -acetaminophen  (NORCO/VICODIN) 5-325 MG tablet  Every 6 hours PRN        01/20/24 1309    predniSONE  (STERAPRED UNI-PAK 21 TAB) 10 MG (21) TBPK tablet  Daily        01/20/24 1309               Armenta Canning, MD 01/20/24 1321

## 2024-01-20 NOTE — Discharge Instructions (Signed)
 1.  Your findings are consistent with sciatica.  Review educational information or discharge instructions. 2.  You are being started on a prednisone  taper.  This will help with inflammation and pain.  You have been given a stronger pain medication called hydrocodone  or Vicodin to take as needed.  This is a narcotic pain medication.  Narcotic pain medications can be addictive and cause constipation.  As soon as your pain is improving adequately decrease the use of your hydrocodone  and start using extra strength Tylenol  by itself. 3.  You may need physical therapy or ongoing treatments for pain.  Follow-up with your doctor for recheck to get referrals if needed.  Return to the emergency department if you have weakness numbness loss of control or bowel or bladder or other significant concerning symptoms. 4.  You do have degenerative arthritis in the back on your x-ray.  However, sciatica is something that can come and go.  This does not mean you will always have the problem.  Is important you follow-up with your doctor for additional information on best ways to manage this.

## 2024-01-20 NOTE — ED Triage Notes (Signed)
 Pt ambulatory to triage with complaints RIGHT sided low back pain that is moving down the back of his leg. Pt states that his pain began Monday night shortly after lying down.   PT appears uncomfortable, but otherwise in no distress during triage.

## 2024-01-25 ENCOUNTER — Ambulatory Visit: Payer: Self-pay

## 2024-01-25 ENCOUNTER — Encounter: Payer: Self-pay | Admitting: Family Medicine

## 2024-01-25 ENCOUNTER — Ambulatory Visit: Admitting: Family Medicine

## 2024-01-25 VITALS — BP 150/85 | HR 100 | Temp 97.9°F | Resp 16 | Ht 70.0 in | Wt 185.0 lb

## 2024-01-25 DIAGNOSIS — M545 Low back pain, unspecified: Secondary | ICD-10-CM

## 2024-01-25 DIAGNOSIS — M79606 Pain in leg, unspecified: Secondary | ICD-10-CM

## 2024-01-25 DIAGNOSIS — L309 Dermatitis, unspecified: Secondary | ICD-10-CM | POA: Insufficient documentation

## 2024-01-25 DIAGNOSIS — I10 Essential (primary) hypertension: Secondary | ICD-10-CM | POA: Diagnosis not present

## 2024-01-25 MED ORDER — DESOXIMETASONE 0.25 % EX OINT: 1.0000 | TOPICAL_OINTMENT | Freq: Two times a day (BID) | CUTANEOUS | 2 refills | Status: AC

## 2024-01-25 MED ORDER — TIZANIDINE HCL 4 MG PO TABS: 4.0000 mg | ORAL_TABLET | Freq: Three times a day (TID) | ORAL | 0 refills | Status: AC | PRN

## 2024-01-25 MED ORDER — LISINOPRIL 20 MG PO TABS: 20.0000 mg | ORAL_TABLET | Freq: Every day | ORAL | 2 refills | Status: AC

## 2024-01-25 NOTE — Telephone Encounter (Signed)
 FYI Only or Action Required?: FYI only for provider: appointment scheduled on 01/25/24.  Patient was last seen in primary care on 09/11/2022 by Johnny Garnette LABOR, MD.  Called Nurse Triage reporting Leg Pain and Back Pain.  Symptoms began a week ago.  Interventions attempted: Prescription medications: Vicodin and prednisone .  Symptoms are: gradually improving.  Triage Disposition: See Physician Within 24 Hours  Patient/caregiver understands and will follow disposition?: Yes                               1. ONSET: When did the pain begin? (e.g., minutes, hours, days)     Last Monday, seen in ED on 01/20/24, wife reports symptoms have improved slightly since ED visit 2. LOCATION: Where does it hurt? (upper, mid or lower back)     Lower back and upper buttocks 3. SEVERITY: How bad is the pain?  (e.g., Scale 1-10; mild, moderate, or severe)     Patient rates pain a 4-5 4. PATTERN: Is the pain constant? (e.g., yes, no; constant, intermittent)      Constant 5. RADIATION: Does the pain shoot into your legs or somewhere else?     Radiates down right leg 6. CAUSE:  What do you think is causing the back pain?      Sciatic pain, per ED note 8. MEDICINES: What have you taken so far for the pain? (e.g., nothing, acetaminophen , NSAIDS)     Vicodin and prednisone , prescribed in ED Wife reports medications are helping pain, but quickly wear off 9. NEUROLOGIC SYMPTOMS: Do you have any weakness, numbness, or problems with bowel/bladder control?     Numbness and tingling in leg 10. OTHER SYMPTOMS: Do you have any other symptoms? (e.g., fever, abdomen pain, burning with urination, blood in urine)     Reports he is mostly limping when walking, able to bear weight, reports that he has had to drag foot twice within the last week Denies urinary problems, denies abdominal pain, denies hematuria, denies fever    This RN spoke to patient and his wife (on [place]).  This RN advised in-person evaluation. Patient scheduled for HFU today with PCP. Patient has been advised to call back if symptoms worsen.   Copied from CRM #[id number]. Topic: Clinical - Red Word Triage >> Jan 25, 2024  8:58 AM Adelita E wrote: [facility] that prompted transfer to Nurse Triage: Patient's wife, Tressie, called in stating that patient was seen in the ER 11/26 and he is still in pain, back pain and right side pain as well.  Reason for Disposition  Numbness in a leg or foot (i.e., loss of sensation)  Protocols used: Back Pain-A-AH

## 2024-01-25 NOTE — Progress Notes (Unsigned)
 Chief Complaint  Patient presents with   Back Pain    Follow-up ED visit for Back pain    Discussed the use of AI scribe software for clinical note transcription with the patient, who gave verbal consent to proceed.  History of Present Illness Brian Barrett is a 63 year old male with hypertension and aortic atherosclerosis here today to follow on recent ED visit. He was last seen on 12/10/21.  He has been experiencing back pain that began suddenly last Monday night, radiating to the right leg. The pain is described as a 6 out of 10 in intensity and is constant. He also experiences numbness in his right foot. No saddle anesthesia or bladder/bowel dysfunction. Negative for fever, chills, abdominal pain, nausea, vomiting, or blood in the urine. The pain is new and unlike any previous episodes of back pain.  He visited the emergency department on January 20, 2024, where he was prescribed systemic steroids (prednisone ) and hydrocodone -acetaminophen  5-325 mg for pain management. He has been taking prednisone  for five days, currently at a dose of two tablets twice daily, with four tablets remaining. The hydrocodone -acetaminophen  helps him sleep but does not alleviate the pain when he is active. 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 No hx of trauma or unusual physical activity.  HTN: His home blood pressure readings are typically around 130/85 mmHg. He is currently taking lisinopril  20 mg daily.  Negative for severe/frequent headache, visual changes, chest pain, dyspnea, palpitation, focal weakness, or edema.  Lab Results  Component Value Date   NA 138 01/20/2024   CL 101 01/20/2024   K 4.3 01/20/2024   CO2 27 01/20/2024   BUN 7 (L) 01/20/2024   CREATININE 0.69 01/20/2024   GFRNONAA >60 01/20/2024   CALCIUM 9.3 01/20/2024   ALBUMIN 4.2 01/20/2024   GLUCOSE 97 01/20/2024   He also has a history of  hand eczema, for which he uses a prescribed Desoximetasone  cream. He has had this condition for a couple of years, with spots primarily on his hands. Topical steroid helps, he needs a refill.  Review of Systems  Constitutional:  Positive for activity change. Negative for appetite change, chills and fever.  HENT:  Negative for sore throat.   Respiratory:  Negative for cough and wheezing.   Gastrointestinal:  Negative for abdominal pain, nausea and vomiting.  Genitourinary:  Negative for decreased urine volume, dysuria and hematuria.  Neurological:  Negative for syncope and facial asymmetry.  Psychiatric/Behavioral:  Negative for confusion and hallucinations.   See other pertinent positives and negatives in HPI.  Current Outpatient Medications on File Prior to Visit  Medication Sig Dispense Refill   Desoximetasone  (TOPICORT ) 0.25 % ointment Apply 1 Application topically 2 (two) times daily. 30 g 2   HYDROcodone -acetaminophen  (NORCO/VICODIN) 5-325 MG tablet Take 1-2 tablets by mouth every 6 (six) hours as needed. 20 tablet 0   Olopatadine HCl (PAZEO) 0.7 % SOLN Apply 1 drop to eye daily. 2.5 mL 0   predniSONE  (STERAPRED UNI-PAK 21 TAB) 10 MG (21) TBPK tablet Take by mouth daily. Take 6 tabs by mouth daily  for 2 days, then 5 tabs for 2 days, then 4 tabs for 2 days, then 3 tabs for 2 days, 2 tabs for 2 days, then 1 tab by mouth daily for 2 days 42 tablet 0   No current facility-administered medications on file prior to visit.  Past Medical History:  Diagnosis Date   Hypertension    Tobacco abuse 05/26/2014   Allergies  Allergen Reactions   Ivp Dye [Iodinated Contrast Media] Swelling    Social History   Socioeconomic History   Marital status: Married    Spouse name: Not on file   Number of children: Not on file   Years of education: Not on file   Highest education level: Not on file  Occupational History   Not on file  Tobacco Use   Smoking status: Every Day    Current  packs/day: 0.50    Average packs/day: 0.5 packs/day for 5.0 years (2.5 ttl pk-yrs)    Types: Cigarettes   Smokeless tobacco: Never  Substance and Sexual Activity   Alcohol use: Yes    Alcohol/week: 0.0 standard drinks of alcohol    Comment: daily intake of 2-3 liquor drinks and 3 beers daily   Drug use: No   Sexual activity: Not on file  Other Topics Concern   Not on file  Social History Narrative   Work or School: [employer] Situation: lives with wife whom sees us       Spiritual Beliefs: believes in God      Lifestyle: not now; diet is good      Social Drivers of Corporate Investment Banker Strain: Not on file  Food Insecurity: Not on file  Transportation Needs: Not on file  Physical Activity: Not on file  Stress: Not on file  Social Connections: Not on file   Today's Vitals   01/25/24 1516 01/25/24 1612  BP: (!) 160/90 (!) 150/85  Pulse: 100   Resp: 16   Temp: 97.9 F (36.6 C)   SpO2: 95%   Weight: 185 lb (83.9 kg)   Height: 5' 10 (1.778 m)   PainSc: 5    PainLoc: Back    Body mass index is 26.54 kg/m.  Physical Exam Vitals and nursing note reviewed.  Constitutional:      General: He is not in acute distress.    Appearance: He is well-developed.  HENT:     Head: Normocephalic and atraumatic.  Eyes:     Conjunctiva/sclera: Conjunctivae normal.  Cardiovascular:     Rate and Rhythm: Normal rate and regular rhythm.     Pulses:          Dorsalis pedis pulses are 2+ on the right side and 2+ on the left side.     Heart sounds: No murmur heard. Pulmonary:     Effort: Pulmonary effort is normal. No respiratory distress.     Breath sounds: Normal breath sounds.  Abdominal:     Palpations: Abdomen is soft. There is no hepatomegaly or mass.     Tenderness: There is no abdominal tenderness.  Musculoskeletal:     Lumbar back: No tenderness or bony tenderness.  Lymphadenopathy:     Cervical: No cervical adenopathy.  Skin:     General: Skin is warm.     Findings: No erythema or rash.  Neurological:     General: No focal deficit present.     Mental Status: He is alert and oriented to person, place, and time.     Gait: Gait normal.     Comments: Antalgic gait, not assisted.  Psychiatric:        Mood and Affect: Mood and affect normal.   ASSESSMENT AND PLAN:  [name] was seen today for back pain.  Diagnoses  and all orders for this visit: Orders Placed This Encounter  Procedures   MR Lumbar Spine Wo Contrast   Low back pain radiating to lower extremity Currently he is on Prednisone  taper and Hydrocodone -Acetaminophen  5-325 mg , the latter one is not helping with pain significantly. Lumbar X ray done on 01/20/24 showed mild degenerative disc disease at L5-S1, mild lower lumbar facet degenerative joint disease at L4-L5 and L5-S1, and transitional lumbosacral vertebra at S1. Recommend adding Tizanidine 4 mg at bedtime, side effects discussed. His daughter would like to have lumbar MRI done now. Monitor for new symptoms. Note for work given. Continue daily activities as tolerated.  -     MR LUMBAR SPINE WO CONTRAST; Future -     tiZANidine HCl; Take 1 tablet (4 mg total) by mouth every 8 (eight) hours as needed for up to 15 days for muscle spasms.  Dispense: 30 tablet; Refill: 0  Primary hypertension Assessment & Plan: BP elevated today, improved after a few minutes. Reports lower BP readings at home.  We discussed possible complications of elevated BP. Continue Lisinopril  30 mg daily and low salt diet. Monitor BP at home regularly. F/U in 4 months.  Orders: -     Lisinopril ; Take 1 tablet (20 mg total) by mouth daily.  Dispense: 30 tablet; Refill: 2  Hand eczema Assessment & Plan: Stable. Topical Desoximetasone  helps, continue bid prn, side effects discussed.  Orders: -     Desoximetasone ; Apply 1 Application topically 2 (two) times daily.  Dispense: 30 g; Refill: 2   Return in about  4 months (around 05/25/2024) for chronic problems: HTN.  Pansie Guggisberg G. Chael Urenda, MD  [facility] [place] office.

## 2024-01-25 NOTE — Patient Instructions (Addendum)
 A few things to remember from today's visit:  Primary hypertension - Plan: lisinopril  (ZESTRIL ) 20 MG tablet  Low back pain radiating to lower extremity - Plan: MR Lumbar Spine Wo Contrast, tiZANidine (ZANAFLEX) 4 MG tablet  Hand eczema - Plan: Desoximetasone  (TOPICORT ) 0.25 % ointment Continue Prednisone . Lumbar MRI is being arranged as requested. Monitor blood pressure at home.  If you need refills for medications you take chronically, please call your pharmacy. Do not use My Chart to request refills or for acute issues that need immediate attention. If you send a my chart message, it may take a few days to be addressed, specially if I am not in the office.  Please be sure medication list is accurate. If a new problem present, please set up appointment sooner than planned today.

## 2024-01-27 NOTE — Assessment & Plan Note (Addendum)
 BP elevated today, improved after a few minutes. Reports lower BP readings at home.  We discussed possible complications of elevated BP. Continue Lisinopril  30 mg daily and low salt diet. Monitor BP at home regularly. F/U in 4 months.

## 2024-01-27 NOTE — Assessment & Plan Note (Signed)
 Stable. Topical Desoximetasone  helps, continue bid prn, side effects discussed.

## 2024-02-15 ENCOUNTER — Other Ambulatory Visit

## 2024-02-15 DIAGNOSIS — M47816 Spondylosis without myelopathy or radiculopathy, lumbar region: Secondary | ICD-10-CM | POA: Diagnosis not present

## 2024-02-15 DIAGNOSIS — M48061 Spinal stenosis, lumbar region without neurogenic claudication: Secondary | ICD-10-CM | POA: Diagnosis not present

## 2024-02-15 DIAGNOSIS — M545 Low back pain, unspecified: Secondary | ICD-10-CM

## 2024-02-15 DIAGNOSIS — M5126 Other intervertebral disc displacement, lumbar region: Secondary | ICD-10-CM | POA: Diagnosis not present

## 2024-02-24 ENCOUNTER — Ambulatory Visit: Payer: Self-pay

## 2024-02-24 NOTE — Telephone Encounter (Signed)
 FYI Only or Action Required?: FYI only for provider: appointment scheduled on 1/5.  Patient was last seen in primary care on 01/25/2024 by Jordan, Betty G, MD.  Called Nurse Triage reporting Back Pain.  Symptoms began several weeks ago.  Interventions attempted: Prescription medications: prednisone , tizanidine , tylenol , ibuprofen and Rest, hydration, or home remedies.  Symptoms are: gradually worsening.  Triage Disposition: See HCP Within 4 Hours (Or PCP Triage)  Patient/caregiver understands and will follow disposition?: No, wishes to speak with PCP  Copied from CRM #[id number]. Topic: Clinical - Red Word Triage >> Feb 24, 2024  3:46 PM Deleta RAMAN wrote: Red Word that prompted transfer to Nurse Triage: patient working on mri states symptoms are worsening numbness right leg and foot. With back pain Reason for Disposition  [1] SEVERE back pain (e.g., excruciating, unable to do any normal activities) AND [2] not improved 2 hours after pain medicine  Answer Assessment - Initial Assessment Questions Since end of November- patient with pain in the lower back, causing pain and numbness in his right lower extremity. Numbness from knee down. Denies swelling, weakness. Pain from low back all down his leg to his toes.  ED visit 11/26- MRI 12/22- results have not returned. No reading in the system- images available in PACS  Prednisone , tylenol , muscle relaxers   Supposed to return to work on Monday 1/5 but advised not to since do not have MRI clearance. Able to complete basic ADLS (showering, shaving) unable to go up or down stairs without difficulty.   Appt for Monday when PCP returns- Please call with MRI ASAP. Advised UC/ED if numbness worsens or pain intolerable. Will CB with any questions.  1. ONSET: When did the pain begin? (e.g., minutes, hours, days)     End of November 2. LOCATION: Where does it hurt? (upper, mid or lower back)     Lower back  3. SEVERITY: How bad is the pain?   (e.g., Scale 1-10; mild, moderate, or severe)     7/10, moaning and groaning in his sleep  4. PATTERN: Is the pain constant? (e.g., yes, no; constant, intermittent)      Fluctuates depending on activity  5. RADIATION: Does the pain shoot into your legs or somewhere else?     Right leg  6. CAUSE:  What do you think is causing the back pain?      Unsure  7. BACK OVERUSE:  Any recent lifting of heavy objects, strenuous work or exercise?     denies 8. MEDICINES: What have you taken so far for the pain? (e.g., nothing, acetaminophen , NSAIDS)     Prednisone  helped for a little bit but otherwise, tylenol , ibuprofen, muscle relaxer, tizanidine   (just makes him sleepy)  9. NEUROLOGIC SYMPTOMS: Do you have any weakness, numbness, or problems with bowel/bladder control?     Numbness in right lower leg 10. OTHER SYMPTOMS: Do you have any other symptoms? (e.g., fever, abdomen pain, burning with urination, blood in urine)       denies  Protocols used: Back Pain-A-AH

## 2024-02-29 ENCOUNTER — Ambulatory Visit: Payer: Self-pay | Admitting: Family Medicine

## 2024-02-29 ENCOUNTER — Ambulatory Visit (INDEPENDENT_AMBULATORY_CARE_PROVIDER_SITE_OTHER): Admitting: Family Medicine

## 2024-02-29 ENCOUNTER — Encounter: Payer: Self-pay | Admitting: Family Medicine

## 2024-02-29 VITALS — BP 126/74 | HR 84 | Temp 98.3°F | Resp 16 | Ht 70.0 in | Wt 183.8 lb

## 2024-02-29 DIAGNOSIS — M5416 Radiculopathy, lumbar region: Secondary | ICD-10-CM

## 2024-02-29 DIAGNOSIS — I10 Essential (primary) hypertension: Secondary | ICD-10-CM

## 2024-02-29 MED ORDER — GABAPENTIN 300 MG PO CAPS: 300.0000 mg | ORAL_CAPSULE | Freq: Every day | ORAL | 0 refills | Status: DC

## 2024-02-29 MED ORDER — CELECOXIB 100 MG PO CAPS: 100.0000 mg | ORAL_CAPSULE | Freq: Two times a day (BID) | ORAL | 0 refills | Status: DC

## 2024-02-29 NOTE — Assessment & Plan Note (Signed)
 BP adequately controlled. We discussed possible effects of NSAID's. Currently on Lisinopril  20 mg daily. Monitor BP regularly. Continue low salt diet.

## 2024-02-29 NOTE — Telephone Encounter (Signed)
 Seen today. Lumbar MRI discussed. BJ

## 2024-02-29 NOTE — Patient Instructions (Addendum)
 A few things to remember from today's visit:  Lumbar radiculopathy - Plan: Ambulatory referral to Neurosurgery, gabapentin  (NEURONTIN ) 300 MG capsule  Appt with neurosurgeon will be arranged. Due to pain that is interfering with job duties, letter was provided today. Celebrex  100 mg 2 times daily. Continue over the counter Tylenol . Gabapentin  300 mg daily at bedtime. In 2 weeks let me know if you want to increase dose to 2 times daily.  If you need refills for medications you take chronically, please call your pharmacy. Do not use My Chart to request refills or for acute issues that need immediate attention. If you send a my chart message, it may take a few days to be addressed, specially if I am not in the office.  Please be sure medication list is accurate. If a new problem present, please set up appointment sooner than planned today.

## 2024-02-29 NOTE — Progress Notes (Signed)
 "  ACUTE VISIT Chief Complaint  Patient presents with   Back Pain   Discussed the use of AI scribe software for clinical note transcription with the patient, who gave verbal consent to proceed. History of Present Illness Brian Barrett is a 64 year old male with PMHx significant for HTN and aortic atherosclerosis here today with his daughter c/o right-sided lower back pain radiated to RLE. He was seen on 01/25/24 for same problem. Lumbar MRI done on 02/15/24, result back yesterday. His daughter is concerned about cysts found around lower back. 1. Transitional lumbosacral anatomy. 2. Severe facet arthrosis at L5-S1 with grade 1 anterolisthesis, a right foraminal disc extrusion, and moderate to severe right neural foraminal stenosis with right L5 nerve root impingement.  Failed course of Prednisone .  He has persistent back pain rated at 6 to 7 out of 10, primarily affecting his right side and radiating down to his right leg. The pain has remained about the same since his last visit. RLE numbness and recently, he has also experienced numbness in his left foot, which is a new symptom. No associated weakness, saddle anesthesia, of bowel/bladder dysfunction.  Lab Results  Component Value Date   WBC 5.4 01/20/2024   HGB 15.5 01/20/2024   HCT 45.3 01/20/2024   MCV 96.8 01/20/2024   PLT 167 01/20/2024   Lab Results  Component Value Date   ALT 24 01/20/2024   AST 35 01/20/2024   ALKPHOS 49 01/20/2024   BILITOT 1.3 (H) 01/20/2024   He is currently taking Tylenol  for pain management, along with a muscle relaxant, tizanidine , which provides temporary relief, allowing him to sleep for about two hours before waking up.  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 His workplace does not offer light duty options, and he is unable to continue working under his current condition.  HTN on  Lisinopril  20 mg daily. Lab Results  Component Value Date   NA 138 01/20/2024   CL 101 01/20/2024   K 4.3 01/20/2024   CO2 27 01/20/2024   BUN 7 (L) 01/20/2024   CREATININE 0.69 01/20/2024   GFRNONAA >60 01/20/2024   CALCIUM 9.3 01/20/2024   ALBUMIN 4.2 01/20/2024   GLUCOSE 97 01/20/2024   Review of Systems  Constitutional:  Negative for activity change, appetite change, chills and fever.  HENT:  Negative for mouth sores and sore throat.   Respiratory:  Negative for shortness of breath.   Cardiovascular:  Negative for chest pain, palpitations and leg swelling.  Gastrointestinal:  Negative for abdominal pain, nausea and vomiting.  Genitourinary:  Negative for decreased urine volume, dysuria and hematuria.  Skin:  Negative for rash.  Neurological:  Positive for numbness. Negative for syncope and headaches.  See other pertinent positives and negatives in HPI.  Medications Ordered Prior to Encounter[1]  Past Medical History:  Diagnosis Date   Hypertension    Tobacco abuse 05/26/2014   Allergies[2]  Social History   Socioeconomic History   Marital status: Married    Spouse name: Not on file   Number of children: Not on file   Years of education: Not on file   Highest education level: Not on file  Occupational History   Not on file  Tobacco Use   Smoking status: Every Day    Current packs/day: 0.50    Average packs/day: 0.5 packs/day for 5.0 years (2.5 ttl  pk-yrs)    Types: Cigarettes   Smokeless tobacco: Never  Substance and Sexual Activity   Alcohol use: Yes    Alcohol/week: 0.0 standard drinks of alcohol    Comment: daily intake of 2-3 liquor drinks and 3 beers daily   Drug use: No   Sexual activity: Not on file  Other Topics Concern   Not on file  Social History Narrative   Work or School: [employer] Situation: lives with wife whom sees us       Spiritual Beliefs: believes in God      Lifestyle: not now; diet is good       Social Drivers of Health   Tobacco Use: High Risk (02/29/2024)   Patient History    Smoking Tobacco Use: Every Day    Smokeless Tobacco Use: Never    Passive Exposure: Not on file  Financial Resource Strain: Not on file  Food Insecurity: Not on file  Transportation Needs: Not on file  Physical Activity: Not on file  Stress: Not on file  Social Connections: Not on file  Depression (PHQ2-9): Low Risk (01/27/2024)   Depression (PHQ2-9)    PHQ-2 Score: 0  Alcohol Screen: Not on file  Housing: Not on file  Utilities: Not on file  Health Literacy: Not on file   Vitals:   02/29/24 1452  BP: 126/74  Pulse: 84  Resp: 16  Temp: 98.3 F (36.8 C)  SpO2: 93%   Body mass index is 26.37 kg/m.  Physical Exam Vitals and nursing note reviewed.  Constitutional:      General: He is not in acute distress.    Appearance: He is well-developed.  HENT:     Head: Normocephalic and atraumatic.  Eyes:     Conjunctiva/sclera: Conjunctivae normal.  Cardiovascular:     Rate and Rhythm: Normal rate and regular rhythm.     Pulses:          Dorsalis pedis pulses are 2+ on the right side and 2+ on the left side.     Heart sounds: No murmur heard. Pulmonary:     Effort: Pulmonary effort is normal. No respiratory distress.     Breath sounds: Normal breath sounds.  Abdominal:     Palpations: Abdomen is soft. There is no hepatomegaly or mass.     Tenderness: There is no abdominal tenderness.  Musculoskeletal:     Lumbar back: Spasms present. No tenderness or bony tenderness.       Back:  Skin:    General: Skin is warm.     Findings: No erythema or rash.  Neurological:     General: No focal deficit present.     Mental Status: He is alert and oriented to person, place, and time.     Gait: Gait normal.     Deep Tendon Reflexes:     Reflex Scores:      Patellar reflexes are 1+ on the right side and 2+ on the left side.    Comments: Antalgic gait, not assisted. Able to walk on tip toes and  heels.  Psychiatric:        Mood and Affect: Mood and affect normal.    ASSESSMENT AND PLAN:  [name] was seen today for back pain.  Diagnoses and all orders for this visit: Orders Placed This Encounter  Procedures   Ambulatory referral to Neurosurgery   Lumbar radiculopathy Symptoms are persistent, now affecting both LE's. We discussed options, including  repeating Prednisone  taper, which he prefers not to do at this time given the fact it did not help at all, completed treatment fist week of 01/2024. Zanaflex  did not provide significant relief. Lumbar MRI done on 02/15/24, report back yesterday.We discussed report in detail. Synovial cyst x 2, 13 mm and 5 mm, the latter one seems to be contributing to spinal stenosis. Also noted hemangioma in the L4 vertebral body. No fracture or suspicious marrow lesion.  Recommend consultation with neurosurgeon. He agrees with trying Celebrex  100 mg twice daily as needed and gabapentin  300 mg at bedtime. We discussed side effects of medications. Instructed to let me know in about 2 weeks if he wants to increase dose of gabapentin . 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 He was clearly instructed about warning signs. -     Ambulatory referral to Neurosurgery -     Celecoxib ; Take 1 capsule (100 mg total) by mouth 2 (two) times daily.  Dispense: 40 capsule; Refill: 0 -     Gabapentin ; Take 1 capsule (300 mg total) by mouth at bedtime.  Dispense: 30 capsule; Refill: 0  Primary hypertension Assessment & Plan: BP adequately controlled. We discussed possible effects of NSAID's. Currently on Lisinopril  20 mg daily. Monitor BP regularly. Continue low salt diet.  I personally spent a total of 42 minutes in the care of the patient today including preparing to see the patient, getting/reviewing separately  obtained history, performing a medically appropriate exam/evaluation, counseling and educating, placing orders, documenting clinical information in the EHR, and communicating results.  Return in about 6 months (around 08/28/2024) for chronic problems.  Ameerah Huffstetler G. Cailan General, MD  [facility]. [place] office.     [1]  Current Outpatient Medications on File Prior to Visit  Medication Sig Dispense Refill   Desoximetasone  (TOPICORT ) 0.25 % ointment Apply 1 Application topically 2 (two) times daily. 30 g 2   lisinopril  (ZESTRIL ) 20 MG tablet Take 1 tablet (20 mg total) by mouth daily. 30 tablet 2   No current facility-administered medications on file prior to visit.  [2]  Allergies Allergen Reactions   Ivp Dye [Iodinated Contrast Media] Swelling   "

## 2024-03-08 ENCOUNTER — Telehealth: Payer: Self-pay

## 2024-03-08 NOTE — Telephone Encounter (Signed)
 Copied from CRM [id number]. Topic: Clinical - Medical Advice >> Mar 08, 2024  4:10 PM Rea ORN wrote: Reason for CRM: Pt wife Tressie calling to advise the pt will not see the neuro surgeon until 1/29. His return to work was in 2 weeks which falls on 1/19. They are requesting an extension for his time off of work to 03/28/24 so that he can meet with neuro first. Please fax new letter to [phone]  Please call back to advise,  [phone]

## 2024-03-09 NOTE — Telephone Encounter (Signed)
 If he is still in severe pain that limit activities at work, note can be extended until 03/25/2024.  Future restrictions to be determined by specialist. Thanks, BJ

## 2024-03-09 NOTE — Telephone Encounter (Signed)
 Work note has been given and patient and patients wife is aware of work note in [employer]. Patients wife was informed that we do not complete Short term disability.

## 2024-03-23 NOTE — Progress Notes (Signed)
 "  Referring Physician:  Jordan, Betty G, MD [address] [place],  [place] [address]  Primary Physician:  Jordan, Betty G, MD  History of Present Illness: 03/24/2024 [name] is here today with a chief complaint of low back pain that radiates to his right lower extremity over 2 months.  He did have some numbness in his feet previously, but now it is consistent in both the top and bottom of his foot and he is having difficulty driving secondary to not being able to feel the pedal.  He started noticing that he is tripping when walking his foot slaps when he tries to walk.  As a result his gait is altered which makes him more fatigued.  He has not had any relief with Celebrex  and gabapentin .   Duration: x 2 months   Bowel/Bladder Dysfunction: none  Conservative measures:  Physical therapy: Has not participated in Multimodal medical therapy including regular antiinflammatories:  ibuprofen, Tylenol , prednisone , Vicodin, tizanidine    Injections:  no epidural steroid injections  Past Surgery: no spine surgeries  Brian Barrett has no symptoms of cervical myelopathy.  The symptoms are causing a significant impact on the patient's life.   Review of Systems:  A 10 point review of systems is negative, except for the pertinent positives and negatives detailed in the HPI.  Past Medical History: Past Medical History:  Diagnosis Date   Hypertension    Tobacco abuse 05/26/2014    Past Surgical History: Past Surgical History:  Procedure Laterality Date   HAND SURGERY Right    had fracture   HERNIA REPAIR     x 2    Allergies: Allergies as of 03/24/2024 - Review Complete 03/24/2024  Allergen Reaction Noted   Ivp dye [iodinated contrast media] Swelling 11/12/2021    Medications: Outpatient Encounter Medications as of 03/24/2024  Medication Sig   Desoximetasone  (TOPICORT ) 0.25 % ointment Apply 1 Application topically 2 (two) times daily.   lisinopril  (ZESTRIL )  20 MG tablet Take 1 tablet (20 mg total) by mouth daily.   [DISCONTINUED] celecoxib  (CELEBREX ) 100 MG capsule Take 1 capsule (100 mg total) by mouth 2 (two) times daily.   [DISCONTINUED] gabapentin  (NEURONTIN ) 300 MG capsule Take 1 capsule (300 mg total) by mouth at bedtime.   No facility-administered encounter medications on file as of 03/24/2024.    Social History: Social History[1]  Family Medical History: Family History  Problem Relation Age of Onset   Other Mother        deceased   Bone cancer Paternal Uncle     Physical Examination: @VITALWITHPAIN @  General: Patient is well developed, well nourished, calm, collected, and in no apparent distress. Attention to examination is appropriate.  Psychiatric: Patient is non-anxious.  Head:  Pupils equal, round, and reactive to light.  ENT:  Oral mucosa appears well hydrated.  Neck:   Supple.  Full range of motion.  Respiratory: Patient is breathing without any difficulty.  Extremities: No edema.  Vascular: Palpable dorsal pedal pulses.  Skin:   On exposed skin, there are no abnormal skin lesions.  NEUROLOGICAL:     Awake, alert, oriented to person, place, and time.  Speech is clear and fluent. Fund of knowledge is appropriate.   Cranial Nerves: Pupils equal round and reactive to light.  Facial tone is symmetric.   ROM of spine: Minimal tenderness palpation of lumbar paraspinals.    Strength:  Side Iliopsoas Quads Hamstring PF DF EHL  R 5 5 4+ 5 3  4+  L 5 5 5 5 5  4+  1+ patella, hamstring on the right, 2+ on the left patella and hamstroing. Absent achilles bialterally.  Gait is altered.  Negative straight leg raise.   Medical Decision Making  Imaging: MRI LUMBAR SPINE 02/15/2024 05:10:19 PM   TECHNIQUE: Multiplanar multisequence MRI of the lumbar spine was performed without the administration of intravenous contrast.   COMPARISON: Lumbar spine radiographs 01/20/2024. Chest radiographs 05/18/2017.    CLINICAL HISTORY: Lower back pain radiated to RLE, currently on Prednisone .   FINDINGS:   BONES AND ALIGNMENT: Transitional lumbosacral anatomy with partially lumbarized S1. Trace anterolisthesis of L5 on S1. Moderate marrow edema about the left greater than right L5-S1 facet joints, degenerative in appearance. Hemangioma in the L4 vertebral body. No fracture or suspicious marrow lesion.   SPINAL CORD: The conus medullaris terminates at L1-L2 and is normal in signal.   SOFT TISSUES: No paraspinal mass.   DISC LEVELS: Disc desiccation, greatest at L3-L4 and L5-S1 with up to mild associated disc space narrowing.   T12-L1: Only imaged sagittally and negative.   L1-L2: Negative.   L2-L3: Mild facet hypertrophy without disc herniation or stenosis.   L3-L4: Mild disc bulging, a small left foraminal disc protrusion, and mild facet hypertrophy result in minimal to mild left neural foraminal stenosis without spinal stenosis.   L4-L5: Mild disc bulging and moderate to severe facet and ligamentum flavum hypertrophy without stenosis.   L5-S1: Anterolisthesis with bulging of uncovered disc, a right subarticular to right foraminal disc extrusion with mild cephalad migration, prominent epidural fat, severe facet and ligamentum flavum hypertrophy, a 13 mm synovial cyst medial to the right facet joint within the ligamentum flavum, and a 5 mm synovial cyst medial to the left facet joint result in mild spinal stenosis, mild to moderate left lateral recess stenosis, and moderate to severe right and mild left neural foraminal stenosis, likely with right L5 nerve root impingement by the disc extrusion. Moderate right and small left facet joint effusions.   S1-S2: Transitional anatomy with nearly fully formed disc. Mild facet hypertrophy. No disc herniation or stenosis.   IMPRESSION: 1. Transitional lumbosacral anatomy. 2. Severe facet arthrosis at L5-S1 with grade 1  anterolisthesis, a right foraminal disc extrusion, and moderate to severe right neural foraminal stenosis with right L5 nerve root impingement.  I have personally reviewed the images and agree with the above interpretation.  Assessment and Plan: [name] is a pleasant 64 y.o. male with severe facet arthrosis at L5-S1 with listhesis who currently has right foraminal disc extrusion at L5 causing severe foraminal stenosis.  He presents today with a right foot drop and gait instability.  I discussed this with the patient at length today.  Concerned due to his significant weakness and gait instability.  I went ahead and placed a physical therapy referral for him, but due to his significant weakness would like him to discuss with the surgeon in clinic as soon as possible.  I will arrange this for him.   Thank you for involving me in the care of this patient.    Lyle Decamp, PA-C Dept. of Neurosurgery     [1]  Social History Tobacco Use   Smoking status: Every Day    Current packs/day: 0.50    Average packs/day: 0.5 packs/day for 48.1 years (24.0 ttl pk-yrs)    Types: Cigarettes    Start date: 40   Smokeless tobacco: Never   Tobacco comments:    He did  quit for 5 years before and then started back  Vaping Use   Vaping status: Never Used  Substance Use Topics   Alcohol use: Yes    Alcohol/week: 0.0 standard drinks of alcohol    Comment: daily intake of 2-3 liquor drinks and 3 beers daily   Drug use: No   "

## 2024-03-24 ENCOUNTER — Ambulatory Visit

## 2024-03-24 ENCOUNTER — Encounter: Payer: Self-pay | Admitting: Physician Assistant

## 2024-03-24 ENCOUNTER — Ambulatory Visit: Admitting: Physician Assistant

## 2024-03-24 VITALS — BP 138/86 | Ht 70.0 in | Wt 176.0 lb

## 2024-03-24 DIAGNOSIS — M5126 Other intervertebral disc displacement, lumbar region: Secondary | ICD-10-CM | POA: Diagnosis not present

## 2024-03-24 DIAGNOSIS — M21371 Foot drop, right foot: Secondary | ICD-10-CM | POA: Diagnosis not present

## 2024-03-24 DIAGNOSIS — M4317 Spondylolisthesis, lumbosacral region: Secondary | ICD-10-CM | POA: Diagnosis not present

## 2024-03-24 DIAGNOSIS — M5416 Radiculopathy, lumbar region: Secondary | ICD-10-CM

## 2024-03-24 DIAGNOSIS — M549 Dorsalgia, unspecified: Secondary | ICD-10-CM | POA: Diagnosis not present

## 2024-03-24 DIAGNOSIS — R2689 Other abnormalities of gait and mobility: Secondary | ICD-10-CM

## 2024-03-24 NOTE — Progress Notes (Signed)
 Sent msg to [facility]

## 2024-03-30 NOTE — Progress Notes (Unsigned)
 "  Referring Physician:  Jordan, Betty G, MD [address] [place],  [place] [address]  Primary Physician:  Jordan, Betty G, MD   History of Present Illness: 04/01/2024 Brian Barrett is here today   Discuss surgery   History of Present Illness: 03/24/2024 note from Lyle Decamp, PA-C Mr. [name] is here today with a chief complaint of low back pain that radiates to his right lower extremity over 2 months.  He did have some numbness in his feet previously, but now it is consistent in both the top and bottom of his foot and he is having difficulty driving secondary to not being able to feel the pedal.  He started noticing that he is tripping when walking his foot slaps when he tries to walk.  As a result his gait is altered which makes him more fatigued.  He has not had any relief with Celebrex  and gabapentin .   Duration: x 2 months   Bowel/Bladder Dysfunction: none  Conservative measures:  Physical therapy: Has not participated in Multimodal medical therapy including regular antiinflammatories:  ibuprofen, Tylenol , prednisone , Vicodin, tizanidine    Injections:  no epidural steroid injections  Past Surgery: no spine surgeries  Brian Barrett has no symptoms of cervical myelopathy.  The symptoms are causing a significant impact on the patient's life.   Review of Systems:  A 10 point review of systems is negative, except for the pertinent positives and negatives detailed in the HPI.  Past Medical History: Past Medical History:  Diagnosis Date   Hypertension    Tobacco abuse 05/26/2014    Past Surgical History: Past Surgical History:  Procedure Laterality Date   HAND SURGERY Right    had fracture   HERNIA REPAIR     x 2    Allergies: Allergies as of 03/24/2024 - Review Complete 03/24/2024  Allergen Reaction Noted   Ivp dye [iodinated contrast media] Swelling 11/12/2021    Medications: Outpatient Encounter Medications as of 03/24/2024   Medication Sig   Desoximetasone  (TOPICORT ) 0.25 % ointment Apply 1 Application topically 2 (two) times daily.   lisinopril  (ZESTRIL ) 20 MG tablet Take 1 tablet (20 mg total) by mouth daily.   [DISCONTINUED] celecoxib  (CELEBREX ) 100 MG capsule Take 1 capsule (100 mg total) by mouth 2 (two) times daily.   [DISCONTINUED] gabapentin  (NEURONTIN ) 300 MG capsule Take 1 capsule (300 mg total) by mouth at bedtime.   No facility-administered encounter medications on file as of 03/24/2024.    Social History: [Social History]  [Social History] Tobacco Use   Smoking status: Every Day    Current packs/day: 0.50    Average packs/day: 0.5 packs/day for 48.1 years (24.0 ttl pk-yrs)    Types: Cigarettes    Start date: 1978   Smokeless tobacco: Never   Tobacco comments:    He did quit for 5 years before and then started back  Vaping Use   Vaping status: Never Used  Substance Use Topics   Alcohol use: Yes    Alcohol/week: 0.0 standard drinks of alcohol    Comment: daily intake of 2-3 liquor drinks and 3 beers daily   Drug use: No    Family Medical History: Family History  Problem Relation Age of Onset   Other Mother        deceased   Bone cancer Paternal Uncle     Physical Examination: @VITALWITHPAIN @  General: Patient is well developed, well nourished, calm, collected, and in no apparent distress. Attention to examination is appropriate.  Psychiatric: Patient is non-anxious.  Head:  Pupils equal, round, and reactive to light.  ENT:  Oral mucosa appears well hydrated.  Neck:   Supple.  Full range of motion.  Respiratory: Patient is breathing without any difficulty.  Extremities: No edema.  Vascular: Palpable dorsal pedal pulses.  Skin:   On exposed skin, there are no abnormal skin lesions.  NEUROLOGICAL:     Awake, alert, oriented to person, place, and time.  Speech is clear and fluent. Fund of knowledge is appropriate.   Cranial Nerves: Pupils equal round and reactive to  light.  Facial tone is symmetric.   ROM of spine: Minimal tenderness palpation of lumbar paraspinals.    Strength:  Side Iliopsoas Quads Hamstring PF DF EHL  R 5 5 4+ 5 3 4+  L 5 5 5 5 5  4+  1+ patella, hamstring on the right, 2+ on the left patella and hamstroing. Absent achilles bialterally.  Gait is altered.  Negative straight leg raise.   Medical Decision Making  Imaging: MRI LUMBAR SPINE 02/15/2024 05:10:19 PM   TECHNIQUE: Multiplanar multisequence MRI of the lumbar spine was performed without the administration of intravenous contrast.   COMPARISON: Lumbar spine radiographs 01/20/2024. Chest radiographs 05/18/2017.   CLINICAL HISTORY: Lower back pain radiated to RLE, currently on Prednisone .   FINDINGS:   BONES AND ALIGNMENT: Transitional lumbosacral anatomy with partially lumbarized S1. Trace anterolisthesis of L5 on S1. Moderate marrow edema about the left greater than right L5-S1 facet joints, degenerative in appearance. Hemangioma in the L4 vertebral body. No fracture or suspicious marrow lesion.   SPINAL CORD: The conus medullaris terminates at L1-L2 and is normal in signal.   SOFT TISSUES: No paraspinal mass.   DISC LEVELS: Disc desiccation, greatest at L3-L4 and L5-S1 with up to mild associated disc space narrowing.   T12-L1: Only imaged sagittally and negative.   L1-L2: Negative.   L2-L3: Mild facet hypertrophy without disc herniation or stenosis.   L3-L4: Mild disc bulging, a small left foraminal disc protrusion, and mild facet hypertrophy result in minimal to mild left neural foraminal stenosis without spinal stenosis.   L4-L5: Mild disc bulging and moderate to severe facet and ligamentum flavum hypertrophy without stenosis.   L5-S1: Anterolisthesis with bulging of uncovered disc, a right subarticular to right foraminal disc extrusion with mild cephalad migration, prominent epidural fat, severe facet and ligamentum flavum  hypertrophy, a 13 mm synovial cyst medial to the right facet joint within the ligamentum flavum, and a 5 mm synovial cyst medial to the left facet joint result in mild spinal stenosis, mild to moderate left lateral recess stenosis, and moderate to severe right and mild left neural foraminal stenosis, likely with right L5 nerve root impingement by the disc extrusion. Moderate right and small left facet joint effusions.   S1-S2: Transitional anatomy with nearly fully formed disc. Mild facet hypertrophy. No disc herniation or stenosis.   IMPRESSION: 1. Transitional lumbosacral anatomy. 2. Severe facet arthrosis at L5-S1 with grade 1 anterolisthesis, a right foraminal disc extrusion, and moderate to severe right neural foraminal stenosis with right L5 nerve root impingement.  I have personally reviewed the images and agree with the above interpretation.  Assessment and Plan: Mr. Lederman is a pleasant 64 y.o. male with severe facet arthrosis at L5-S1 with listhesis who currently has right foraminal disc extrusion at L5 causing severe foraminal stenosis.  He presents today with a right foot drop and gait instability.  I discussed this with the patient  at length today.  Concerned due to his significant weakness and gait instability.  I went ahead and placed a physical therapy referral for him, but due to his significant weakness would like him to discuss with the surgeon in clinic as soon as possible.  I will arrange this for him.   Thank you for involving me in the care of this patient.    Lyle Decamp, PA-C Dept. of Neurosurgery "

## 2024-04-01 ENCOUNTER — Ambulatory Visit: Admitting: Neurosurgery

## 2024-04-01 ENCOUNTER — Encounter: Payer: Self-pay | Admitting: Neurosurgery

## 2024-04-01 VITALS — BP 136/74 | Ht 70.0 in | Wt 176.0 lb

## 2024-04-01 DIAGNOSIS — M5416 Radiculopathy, lumbar region: Secondary | ICD-10-CM | POA: Insufficient documentation

## 2024-04-01 DIAGNOSIS — M4317 Spondylolisthesis, lumbosacral region: Secondary | ICD-10-CM | POA: Insufficient documentation

## 2024-04-01 DIAGNOSIS — R29898 Other symptoms and signs involving the musculoskeletal system: Secondary | ICD-10-CM | POA: Insufficient documentation

## 2024-04-05 ENCOUNTER — Ambulatory Visit

## 2024-05-25 ENCOUNTER — Ambulatory Visit: Admitting: Neurosurgery
